# Patient Record
Sex: Female | Born: 1985 | Race: Black or African American | Hispanic: No | Marital: Single | Smoking: Never smoker
Health system: Southern US, Community
[De-identification: ages and names within clinical notes are randomized; demographics above are authoritative.]

## PROBLEM LIST (undated history)

## (undated) DIAGNOSIS — D649 Anemia, unspecified: Secondary | ICD-10-CM

## (undated) DIAGNOSIS — IMO0002 Reserved for concepts with insufficient information to code with codable children: Secondary | ICD-10-CM

## (undated) DIAGNOSIS — N83209 Unspecified ovarian cyst, unspecified side: Secondary | ICD-10-CM

## (undated) DIAGNOSIS — A749 Chlamydial infection, unspecified: Secondary | ICD-10-CM

## (undated) DIAGNOSIS — J45909 Unspecified asthma, uncomplicated: Secondary | ICD-10-CM

## (undated) HISTORY — PX: WISDOM TOOTH EXTRACTION: SHX21

## (undated) HISTORY — DX: Reserved for concepts with insufficient information to code with codable children: IMO0002

## (undated) HISTORY — DX: Chlamydial infection, unspecified: A74.9

## (undated) HISTORY — DX: Unspecified ovarian cyst, unspecified side: N83.209

## (undated) HISTORY — DX: Anemia, unspecified: D64.9

## (undated) HISTORY — DX: Unspecified asthma, uncomplicated: J45.909

---

## 2005-08-25 DIAGNOSIS — R87619 Unspecified abnormal cytological findings in specimens from cervix uteri: Secondary | ICD-10-CM

## 2005-08-25 DIAGNOSIS — IMO0002 Reserved for concepts with insufficient information to code with codable children: Secondary | ICD-10-CM

## 2005-08-25 HISTORY — DX: Reserved for concepts with insufficient information to code with codable children: IMO0002

## 2005-08-25 HISTORY — DX: Unspecified abnormal cytological findings in specimens from cervix uteri: R87.619

## 2009-08-25 DIAGNOSIS — N83209 Unspecified ovarian cyst, unspecified side: Secondary | ICD-10-CM

## 2009-08-25 HISTORY — DX: Unspecified ovarian cyst, unspecified side: N83.209

## 2009-09-25 ENCOUNTER — Inpatient Hospital Stay (HOSPITAL_COMMUNITY): Admission: AD | Admit: 2009-09-25 | Discharge: 2009-09-25 | Payer: Self-pay | Admitting: Obstetrics and Gynecology

## 2009-09-30 ENCOUNTER — Inpatient Hospital Stay (HOSPITAL_COMMUNITY): Admission: AD | Admit: 2009-09-30 | Discharge: 2009-10-01 | Payer: Self-pay | Admitting: Obstetrics and Gynecology

## 2009-12-05 ENCOUNTER — Encounter: Admission: RE | Admit: 2009-12-05 | Discharge: 2009-12-05 | Payer: Self-pay | Admitting: Emergency Medicine

## 2010-01-14 ENCOUNTER — Inpatient Hospital Stay (HOSPITAL_COMMUNITY): Admission: AD | Admit: 2010-01-14 | Discharge: 2010-01-14 | Payer: Self-pay | Admitting: Obstetrics & Gynecology

## 2010-04-21 ENCOUNTER — Emergency Department (HOSPITAL_COMMUNITY): Admission: EM | Admit: 2010-04-21 | Discharge: 2010-04-21 | Payer: Self-pay | Admitting: Family Medicine

## 2010-04-30 ENCOUNTER — Emergency Department (HOSPITAL_COMMUNITY): Admission: EM | Admit: 2010-04-30 | Discharge: 2010-04-30 | Payer: Self-pay | Admitting: Emergency Medicine

## 2010-10-08 ENCOUNTER — Inpatient Hospital Stay (INDEPENDENT_AMBULATORY_CARE_PROVIDER_SITE_OTHER)
Admission: RE | Admit: 2010-10-08 | Discharge: 2010-10-08 | Disposition: A | Discharge status: Home / Self Care | Payer: PRIVATE HEALTH INSURANCE | Source: Ambulatory Visit | Attending: Emergency Medicine | Admitting: Emergency Medicine

## 2010-10-08 DIAGNOSIS — M67919 Unspecified disorder of synovium and tendon, unspecified shoulder: Secondary | ICD-10-CM

## 2010-11-06 ENCOUNTER — Inpatient Hospital Stay (HOSPITAL_COMMUNITY)
Admission: RE | Admit: 2010-11-06 | Discharge: 2010-11-06 | Disposition: A | Discharge status: Home / Self Care | Payer: PRIVATE HEALTH INSURANCE | Source: Ambulatory Visit | Attending: Family Medicine | Admitting: Family Medicine

## 2010-11-11 LAB — WET PREP, GENITAL
Clue Cells Wet Prep HPF POC: NONE SEEN
Yeast Wet Prep HPF POC: NONE SEEN

## 2010-11-11 LAB — CBC: RBC: 3.7 MIL/uL — ABNORMAL LOW (ref 3.87–5.11)

## 2010-11-11 LAB — GC/CHLAMYDIA PROBE AMP, GENITAL: Chlamydia, DNA Probe: NEGATIVE

## 2010-11-14 LAB — URINALYSIS, ROUTINE W REFLEX MICROSCOPIC
Bilirubin Urine: NEGATIVE
Glucose, UA: NEGATIVE mg/dL
Hgb urine dipstick: NEGATIVE
Specific Gravity, Urine: 1.025 (ref 1.005–1.030)
Urobilinogen, UA: 0.2 mg/dL (ref 0.0–1.0)

## 2010-11-14 LAB — ABO/RH: ABO/RH(D): A POS

## 2010-11-14 LAB — CBC
HCT: 36.9 % (ref 36.0–46.0)
MCV: 95.5 fL (ref 78.0–100.0)
RBC: 3.86 MIL/uL — ABNORMAL LOW (ref 3.87–5.11)
WBC: 6.8 10*3/uL (ref 4.0–10.5)

## 2010-11-14 LAB — WET PREP, GENITAL

## 2010-11-14 LAB — HCG, QUANTITATIVE, PREGNANCY: hCG, Beta Chain, Quant, S: 49213 m[IU]/mL — ABNORMAL HIGH (ref <5)

## 2010-11-14 LAB — GC/CHLAMYDIA PROBE AMP, GENITAL: Chlamydia, DNA Probe: NEGATIVE

## 2010-12-11 ENCOUNTER — Inpatient Hospital Stay (INDEPENDENT_AMBULATORY_CARE_PROVIDER_SITE_OTHER)
Admission: RE | Admit: 2010-12-11 | Discharge: 2010-12-11 | Disposition: A | Discharge status: Home / Self Care | Payer: PRIVATE HEALTH INSURANCE | Source: Ambulatory Visit | Attending: Family Medicine | Admitting: Family Medicine

## 2010-12-11 DIAGNOSIS — J40 Bronchitis, not specified as acute or chronic: Secondary | ICD-10-CM

## 2010-12-11 DIAGNOSIS — B86 Scabies: Secondary | ICD-10-CM

## 2010-12-15 ENCOUNTER — Inpatient Hospital Stay (INDEPENDENT_AMBULATORY_CARE_PROVIDER_SITE_OTHER)
Admission: RE | Admit: 2010-12-15 | Discharge: 2010-12-15 | Disposition: A | Discharge status: Home / Self Care | Payer: PRIVATE HEALTH INSURANCE | Source: Ambulatory Visit | Attending: Family Medicine | Admitting: Family Medicine

## 2010-12-15 DIAGNOSIS — K5289 Other specified noninfective gastroenteritis and colitis: Secondary | ICD-10-CM

## 2010-12-15 LAB — POCT URINALYSIS DIP (DEVICE)
Bilirubin Urine: NEGATIVE
Glucose, UA: NEGATIVE mg/dL
Hgb urine dipstick: NEGATIVE
Ketones, ur: NEGATIVE mg/dL
Nitrite: NEGATIVE
Specific Gravity, Urine: 1.02 (ref 1.005–1.030)
Urobilinogen, UA: 1 mg/dL (ref 0.0–1.0)
pH: 7.5 (ref 5.0–8.0)

## 2011-09-09 ENCOUNTER — Emergency Department (HOSPITAL_COMMUNITY)
Admission: EM | Admit: 2011-09-09 | Discharge: 2011-09-09 | Disposition: A | Discharge status: Home / Self Care | Payer: Self-pay | Attending: Emergency Medicine | Admitting: Emergency Medicine

## 2011-09-09 ENCOUNTER — Encounter (HOSPITAL_COMMUNITY): Payer: Self-pay | Admitting: *Deleted

## 2011-09-09 DIAGNOSIS — S01409A Unspecified open wound of unspecified cheek and temporomandibular area, initial encounter: Secondary | ICD-10-CM | POA: Insufficient documentation

## 2011-09-09 DIAGNOSIS — S0181XA Laceration without foreign body of other part of head, initial encounter: Secondary | ICD-10-CM

## 2011-09-09 DIAGNOSIS — W260XXA Contact with knife, initial encounter: Secondary | ICD-10-CM | POA: Insufficient documentation

## 2011-09-09 NOTE — ED Provider Notes (Signed)
History     CSN: [id number]  Arrival date & time 09/09/11  0037   First MD Initiated Contact with Patient 09/09/11 0200      Chief Complaint  Patient presents with  . Laceration    (Consider location/radiation/quality/duration/timing/severity/associated sxs/prior treatment) Patient is a 26 y.o. female presenting with skin laceration. The history is provided by the patient. No language interpreter was used.  Laceration  The incident occurred 1 to 2 hours ago. The laceration is located on the face. The laceration is 2 cm in size. The laceration mechanism was a a dirty knife. The pain is mild. The pain has been constant since onset. She reports no foreign bodies present. Her tetanus status is UTD.    History reviewed. No pertinent past medical history.  History reviewed. No pertinent past surgical history.  History reviewed. No pertinent family history.  History  Substance Use Topics  . Smoking status: Never Smoker   . Smokeless tobacco: Not on file  . Alcohol Use: Yes    OB History    Grav Para Term Preterm Abortions TAB SAB Ect Mult Living                  Review of Systems  Constitutional: Negative for fever, activity change, appetite change and fatigue.  HENT: Negative for congestion, sore throat, rhinorrhea, neck pain and neck stiffness.   Respiratory: Negative for cough and shortness of breath.   Cardiovascular: Negative for chest pain and palpitations.  Gastrointestinal: Negative for nausea, vomiting and abdominal pain.  Genitourinary: Negative for dysuria, urgency, frequency and flank pain.  Skin: Positive for wound. Negative for rash.  Neurological: Negative for dizziness, weakness, light-headedness, numbness and headaches.  All other systems reviewed and are negative.    Allergies  Review of patient's allergies indicates no known allergies.  Home Medications  No current outpatient prescriptions on file.  BP 141/77  Pulse 116  Temp(Src) 99.6 F (37.6  C) (Oral)  Resp 20  SpO2 96%  LMP 09/07/2011  Physical Exam  Nursing note and vitals reviewed. Constitutional: She is oriented to person, place, and time. She appears well-developed and well-nourished. No distress.  HENT:  Head: Normocephalic and atraumatic.  Mouth/Throat: Oropharynx is clear and moist.  Eyes: Conjunctivae and EOM are normal. Pupils are equal, round, and reactive to light.  Neck: Normal range of motion. Neck supple.  Cardiovascular: Normal rate, regular rhythm, normal heart sounds and intact distal pulses.  Exam reveals no gallop and no friction rub.   No murmur heard. Pulmonary/Chest: Effort normal and breath sounds normal. No respiratory distress.  Abdominal: Soft. Bowel sounds are normal. There is no tenderness.  Musculoskeletal: Normal range of motion. She exhibits no tenderness.  Neurological: She is alert and oriented to person, place, and time. No cranial nerve deficit.  Skin: Skin is warm and dry. No rash noted.       2 cm superficial laceration into the dermis of the left cheek    ED Course  Procedures (including critical care time)  LACERATION REPAIR Performed by: Dayton Bailiff Authorized by: Dayton Bailiff Consent: Verbal consent obtained. Risks and benefits: risks, benefits and alternatives were discussed Consent given by: patient Patient identity confirmed: provided demographic data Prepped and Draped in normal sterile fashion Wound explored  Laceration Location: face  Laceration Length: 2 cm  No Foreign Bodies seen or palpated  Anesthesia: local infiltration  Irrigation method: syringe Amount of cleaning: standard  Skin closure: dermabond  Patient tolerance: Patient tolerated the  procedure well with no immediate complications.   Labs Reviewed - No data to display No results found.   1. Facial laceration       MDM  Tetanus is up-to-date so no booster is required. Her laceration was glued in the emergency department. There is  good wound reapproximation. She is discharged with wound care instructions.        Dayton Bailiff, MD 09/09/11 [phone]
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
## 2011-09-13 ENCOUNTER — Emergency Department (HOSPITAL_COMMUNITY)
Admission: EM | Admit: 2011-09-13 | Discharge: 2011-09-13 | Disposition: A | Discharge status: Home / Self Care | Payer: Self-pay | Attending: Emergency Medicine | Admitting: Emergency Medicine

## 2011-09-13 ENCOUNTER — Encounter (HOSPITAL_COMMUNITY): Payer: Self-pay | Admitting: *Deleted

## 2011-09-13 DIAGNOSIS — Z09 Encounter for follow-up examination after completed treatment for conditions other than malignant neoplasm: Secondary | ICD-10-CM | POA: Insufficient documentation

## 2011-09-13 DIAGNOSIS — T8130XA Disruption of wound, unspecified, initial encounter: Secondary | ICD-10-CM

## 2011-09-13 DIAGNOSIS — T8133XA Disruption of traumatic injury wound repair, initial encounter: Secondary | ICD-10-CM | POA: Insufficient documentation

## 2011-09-13 DIAGNOSIS — Y849 Medical procedure, unspecified as the cause of abnormal reaction of the patient, or of later complication, without mention of misadventure at the time of the procedure: Secondary | ICD-10-CM | POA: Insufficient documentation

## 2011-09-13 MED ORDER — LIDOCAINE HCL (PF) 1 % IJ SOLN
INTRAMUSCULAR | Status: AC
  Filled 2011-09-13: qty 5

## 2011-09-13 NOTE — ED Provider Notes (Signed)
History     CSN: [id number]  Arrival date & time 09/13/11  0106   First MD Initiated Contact with Patient 09/13/11 0136      Chief Complaint  Patient presents with  . Wound Check    (Consider location/radiation/quality/duration/timing/severity/associated sxs/prior treatment) HPI Comments: Patient was seen on January 15 for a laceration to her left cheek, cut with a knife.  She opted to have Dermabond placed since that time.  The Dermabond has come off and the wound has opened.  No bleeding no discharge.  Patient is here requesting additional repair  Patient is a 26 y.o. female presenting with wound check. The history is provided by the patient.  Wound Check  She was treated in the ED 5 to 10 days ago. Previous treatment in the ED includes laceration repair. There has been no treatment since the wound repair. There has been no drainage from the wound. There is no redness present. There is no swelling present. The pain has no pain.    History reviewed. No pertinent past medical history.  History reviewed. No pertinent past surgical history.  History reviewed. No pertinent family history.  History  Substance Use Topics  . Smoking status: Never Smoker   . Smokeless tobacco: Not on file  . Alcohol Use: Yes    OB History    Grav Para Term Preterm Abortions TAB SAB Ect Mult Living                  Review of Systems  Constitutional: Negative for activity change.  HENT: Negative for facial swelling.   Musculoskeletal: Negative for arthralgias.  Neurological: Negative for dizziness.    Allergies  Review of patient's allergies indicates no known allergies.  Home Medications  No current outpatient prescriptions on file.  BP 112/62  Pulse 74  Temp(Src) 98.2 F (36.8 C) (Oral)  Resp 20  SpO2 97%  LMP 09/07/2011  Physical Exam  Skin:       1.5 cm superficial laceration mid L cheek without surrounding erythema exudate     ED Course  Procedures (including critical  care time)  Labs Reviewed - No data to display No results found.   1. Wound dehiscence     Discussed with patient the fact that at this point suturing the laceration is not an option would apply Steri-Strips to approximate the wound edges as best we can and patient agrees to this  MDM  Wound dehisced since        Arman Filter, NP 09/13/11 0152  Arman Filter, NP 09/13/11 0154  Arman Filter, NP 09/13/11 0155

## 2011-09-13 NOTE — Discharge Instructions (Signed)
Wound Dehiscence Wound dehiscence is when a surgical cut (incision) opens up. It usually happens 7 to 10 days after surgery. You may have pain, a fever, or have more fluid coming from the cut. It should be treated early. HOME CARE  Only take medicines as told by your doctor.   Take your medicines (antibiotics) as told. Finish them even if you start to feel better.   Wash your wound with warm, soapy water 2 times a day, or as told. Pat the wound dry. Do not rub the wound.   Change bandages (dressings) as often as told. Wash your hands before and after changing bandages. Apply bandages as told.   Take showers. Do not soak the wound, bathe, or swim until your wound is healed.   Avoid exercises that make you sweat.   Use medicines that stop itching as told by your doctor. The wound may itch as it heals. Do not pick or scratch at the wound.   Do not lift more than 10 pounds (4.5 kilograms) until the wound is healed, or as told by your doctor.   Keep all doctor visits as told.  GET HELP RIGHT AWAY IF:   You have more puffiness (swelling) or redness around the wound.   You have more pain in the wound.   You have yellowish white fluid (pus) coming from the wound.   More of the wound breaks open.   You have a fever.  MAKE SURE YOU:   Understand these instructions.   Will watch your condition.   Will get help right away if you are not doing well or get worse.  Document Released: 07/30/2009 Document Revised: 04/23/2011 Document Reviewed: 12/15/2010 [facility] Patient Information 2012 [place], [place]. You were given.  Referral to Dr. Francoise Schaumann, who is a plastic surgeon if you are on the with the appearance of your laceration once it is healed

## 2011-09-13 NOTE — ED Notes (Signed)
She was here jan 15th with a lt face laceration that was glued and it has reopened x 2.

## 2011-09-13 NOTE — ED Provider Notes (Signed)
Medical screening examination/treatment/procedure(s) were performed by non-physician practitioner and as supervising physician I was immediately available for consultation/collaboration. Eithen Castiglia Y.   Gavin Pound. Oletta Lamas, MD 09/13/11 [phone]

## 2012-03-24 ENCOUNTER — Ambulatory Visit (INDEPENDENT_AMBULATORY_CARE_PROVIDER_SITE_OTHER): Payer: BC Managed Care – HMO | Admitting: Obstetrics and Gynecology

## 2012-03-24 ENCOUNTER — Encounter: Payer: Self-pay | Admitting: Obstetrics and Gynecology

## 2012-03-24 VITALS — BP 104/64 | HR 68 | Temp 99.5°F | Resp 16 | Ht 63.0 in | Wt 226.0 lb

## 2012-03-24 DIAGNOSIS — N939 Abnormal uterine and vaginal bleeding, unspecified: Secondary | ICD-10-CM

## 2012-03-24 DIAGNOSIS — Z124 Encounter for screening for malignant neoplasm of cervix: Secondary | ICD-10-CM

## 2012-03-24 DIAGNOSIS — Z202 Contact with and (suspected) exposure to infections with a predominantly sexual mode of transmission: Secondary | ICD-10-CM

## 2012-03-24 DIAGNOSIS — Z9189 Other specified personal risk factors, not elsewhere classified: Secondary | ICD-10-CM

## 2012-03-24 DIAGNOSIS — N926 Irregular menstruation, unspecified: Secondary | ICD-10-CM

## 2012-03-24 DIAGNOSIS — N83209 Unspecified ovarian cyst, unspecified side: Secondary | ICD-10-CM | POA: Insufficient documentation

## 2012-03-24 DIAGNOSIS — A749 Chlamydial infection, unspecified: Secondary | ICD-10-CM | POA: Insufficient documentation

## 2012-03-24 DIAGNOSIS — IMO0002 Reserved for concepts with insufficient information to code with codable children: Secondary | ICD-10-CM | POA: Insufficient documentation

## 2012-03-24 DIAGNOSIS — Z8742 Personal history of other diseases of the female genital tract: Secondary | ICD-10-CM

## 2012-03-24 LAB — CBC
Hemoglobin: 12.7 g/dL (ref 12.0–15.0)
MCV: 90.5 fL (ref 78.0–100.0)
Platelets: 281 10*3/uL (ref 150–400)
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
## 2012-03-25 LAB — HSV 1 ANTIBODY, IGG: HSV 1 Glycoprotein G Ab, IgG: 3.94 IV — ABNORMAL HIGH

## 2012-03-25 LAB — HIV ANTIBODY (ROUTINE TESTING W REFLEX): HIV: NONREACTIVE

## 2012-03-25 LAB — HEPATITIS C ANTIBODY: HCV Ab: NEGATIVE

## 2012-03-26 LAB — PAP IG, CT-NG, RFX HPV ASCU
Chlamydia Probe Amp: POSITIVE — AB
GC Probe Amp: NEGATIVE

## 2012-04-01 ENCOUNTER — Other Ambulatory Visit: Payer: Self-pay | Admitting: Obstetrics and Gynecology

## 2012-04-01 ENCOUNTER — Ambulatory Visit (INDEPENDENT_AMBULATORY_CARE_PROVIDER_SITE_OTHER): Payer: BC Managed Care – HMO | Admitting: Obstetrics and Gynecology

## 2012-04-01 ENCOUNTER — Encounter: Payer: Self-pay | Admitting: Obstetrics and Gynecology

## 2012-04-01 ENCOUNTER — Ambulatory Visit (INDEPENDENT_AMBULATORY_CARE_PROVIDER_SITE_OTHER): Payer: BC Managed Care – HMO

## 2012-04-01 VITALS — BP 120/62 | Temp 98.6°F | Ht 63.0 in | Wt 230.0 lb

## 2012-04-01 DIAGNOSIS — N939 Abnormal uterine and vaginal bleeding, unspecified: Secondary | ICD-10-CM

## 2012-04-01 DIAGNOSIS — N926 Irregular menstruation, unspecified: Secondary | ICD-10-CM

## 2012-04-01 DIAGNOSIS — A749 Chlamydial infection, unspecified: Secondary | ICD-10-CM

## 2012-04-01 DIAGNOSIS — Z8742 Personal history of other diseases of the female genital tract: Secondary | ICD-10-CM

## 2012-04-01 DIAGNOSIS — N83209 Unspecified ovarian cyst, unspecified side: Secondary | ICD-10-CM

## 2012-04-01 MED ORDER — AZITHROMYCIN 500 MG PO TABS: ORAL_TABLET | ORAL | Status: DC

## 2012-04-01 NOTE — Progress Notes (Signed)
FOLLOWUP  Pt here to f/u from 07/31/20213 visit for abnormal uterine bleeding. Pt states that she has just been spotting for the past couple days. Also c/o breast pain and right side pain.    ULTRASOUND: Uterus: Length: 8.08 cm   Width:  5.26 cm   Height:  4.06 cm  Endo thickness:  0.460 mm   Left ovary: Dominant follicle - measuring 4cm x 1.9cm x 2.3c. A cumulus opphorus is seen  Right ovary:Normal Fibroids:no   CDS fluid:no  Comment: Anteverted uterus. No uterine abnormality is seen. Endometrium is thin and unremarkable. Normal pelvic ultrasound.  CHL: POSITIVE  ASSESSMENT: Abnl uterine bleeding possibly due to cervicitis vs hormonal implant Chlamydia Mastodynia without caffiene intake.  Possibly due to hormonal implant Follicular cyst, probably self limited Pelvic pain possibly due to Chlamydia  RECCOMENDATION: zithromax 1 gm po sent to pharmacy Pt declines partner treatment.  Importance re-emphasized.  Condoms recommended. followup 4 weeks for repeat U/S and TOC chlamydia STD PROTOCOL completed

## 2012-04-15 ENCOUNTER — Telehealth: Payer: Self-pay | Admitting: Obstetrics and Gynecology

## 2012-04-15 NOTE — Telephone Encounter (Signed)
Tc to pt per telephone call. Pt c/o left side pain(dull ache) x couple of days. No fever. Pt's lmp on 04-02-12 with bleeding still occuring;however has decreased. Pt passing occ blood clots(largest clot=quarter size). Pt taking otc Ibuprofen and unsure if helps with pain due to pain meds making her fall asleep. Pt with an appt on 04/19/12 for ultrasound fu and visit with vph. Informed pt to cont Ibuprofen as directed, increase water intake, try heating pad/compress. Informed pt will cb if vph gives any further recs. Pt to keep appt on 04/19/12. Pt voices understanding.

## 2012-04-15 NOTE — Telephone Encounter (Signed)
vph pt 

## 2012-04-19 ENCOUNTER — Ambulatory Visit (INDEPENDENT_AMBULATORY_CARE_PROVIDER_SITE_OTHER): Payer: BC Managed Care – HMO

## 2012-04-19 ENCOUNTER — Ambulatory Visit (INDEPENDENT_AMBULATORY_CARE_PROVIDER_SITE_OTHER): Payer: BC Managed Care – HMO | Admitting: Obstetrics and Gynecology

## 2012-04-19 ENCOUNTER — Encounter: Payer: Self-pay | Admitting: Obstetrics and Gynecology

## 2012-04-19 ENCOUNTER — Other Ambulatory Visit: Payer: Self-pay | Admitting: Obstetrics and Gynecology

## 2012-04-19 VITALS — BP 110/68 | Ht 63.0 in | Wt 236.0 lb

## 2012-04-19 DIAGNOSIS — N83209 Unspecified ovarian cyst, unspecified side: Secondary | ICD-10-CM

## 2012-04-19 DIAGNOSIS — A749 Chlamydial infection, unspecified: Secondary | ICD-10-CM

## 2012-04-19 NOTE — Progress Notes (Signed)
FOLLOWUP VISIT  SUBJECTIVE:   Sharp pain resolved.  Still has some dull achy pain.  Not requiring meds.  OBJECTIVE:  ULTRASOUND: Uterus: Length: 7.02 cm   Width:  4.72 cm   Height:  3.86 cm  Endometrium:  0.405 mm Endo thickness:  n/a   Left ovary:Normal Right ovary:Normal Fibroids:no   CDS fluid:no  Comment: Transvaginal images of pelvis. Urinary bladder - unremarkable. Anteverted uterus. Normal appearance. Think endometrium. Both ovaries are well visualized and are WNLs. LTOV dominant follicle/cyst has resolved. No CDS fluid.   Chlaymydia positive.  Pt treated.  Denies sexual activity .  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 TSH, CBC nl  BP 110/68  Ht 5\' 3"  (1.6 m)  Wt 236 lb (107.049 kg)  BMI 41.81 kg/m2  LMP 04/02/2012  Pelvic exam: normal external genitalia, vulva, vagina, cervix, uterus and adnexa  ASSESSMENT: S/P RX for CHL Resolved ovarian cyst  RECOMMENDATION: CHL TOC done F/u 7/14  For aex or prn. Condoms for intercourse.  Marland Kitchen

## 2012-04-20 LAB — GC/CHLAMYDIA PROBE AMP, GENITAL: Chlamydia, DNA Probe: NEGATIVE

## 2012-06-16 ENCOUNTER — Telehealth: Payer: Self-pay | Admitting: Obstetrics and Gynecology

## 2012-06-16 NOTE — Telephone Encounter (Signed)
Pt c/o pelvic pain and would also like her nexplanon removed. Scheduled appointment with EP for 06/25/12, pt agreeable.

## 2012-06-16 NOTE — Telephone Encounter (Signed)
Lm on vm for pt to call back.

## 2012-06-25 ENCOUNTER — Encounter: Payer: Self-pay | Admitting: Obstetrics and Gynecology

## 2012-06-25 ENCOUNTER — Ambulatory Visit (INDEPENDENT_AMBULATORY_CARE_PROVIDER_SITE_OTHER): Payer: BC Managed Care – HMO | Admitting: Obstetrics and Gynecology

## 2012-06-25 VITALS — BP 110/70 | Ht 60.0 in | Wt 232.0 lb

## 2012-06-25 DIAGNOSIS — Z308 Encounter for other contraceptive management: Secondary | ICD-10-CM

## 2012-06-25 DIAGNOSIS — Z139 Encounter for screening, unspecified: Secondary | ICD-10-CM

## 2012-06-25 DIAGNOSIS — N949 Unspecified condition associated with female genital organs and menstrual cycle: Secondary | ICD-10-CM

## 2012-06-25 DIAGNOSIS — R102 Pelvic and perineal pain: Secondary | ICD-10-CM

## 2012-06-25 LAB — POCT URINALYSIS DIPSTICK
Bilirubin, UA: NEGATIVE
Ketones, UA: NEGATIVE
Protein, UA: NEGATIVE
Spec Grav, UA: 1.025
pH, UA: 7

## 2012-06-25 NOTE — Patient Instructions (Signed)
Call [facility] [phone]:  -for temperature of 100.4 degrees Fahrenheit or more -pain not improved with over the counter pain medications (Ibuprofen, Advil, Aleve,     Tylenol or acetaminophen) -for excessive bleeding from insertion site -for excessive swelling redness or green drainage from your insertion site -for any other concerns -keep insertion site clean, dry and covered  for 24 hours -you may remove pressure bandage in 1-4 hours  

## 2012-06-25 NOTE — Progress Notes (Signed)
26 YO with Nexplanon inserted over a year ago but 2 weeks ago started to have sharp intermittant pains in pelvic area but now it is constant and dull. Patient has had this evaluated and admits to a history of an ovarian cyst.  Declines other contraception for now.  O: Right medial upper arm:  Nexplanon removed per protocol with incision closed with steri-strips and Benzoin;  dressed with sterile band-aids and 4 x 4 gauze and Kling pressure dressing  A:  Nexplanon Removal  P: Reviewed signs and symptoms of infection and wound care instructions      RTO-follow up in 1 week  Melynda Krzywicki, PA-C

## 2012-06-30 ENCOUNTER — Telehealth: Payer: Self-pay | Admitting: Obstetrics and Gynecology

## 2012-06-30 NOTE — Telephone Encounter (Signed)
Spoke with pt Denise Roberts msg pt states had nexplanon removed on Friday states pelvic pain getting worse passing clots pt has appt 07/01/12 at 4:00 with ep pt voice understanding

## 2012-07-01 ENCOUNTER — Encounter: Payer: Self-pay | Admitting: Obstetrics and Gynecology

## 2012-07-01 ENCOUNTER — Ambulatory Visit (INDEPENDENT_AMBULATORY_CARE_PROVIDER_SITE_OTHER): Payer: BC Managed Care – HMO | Admitting: Obstetrics and Gynecology

## 2012-07-01 VITALS — BP 112/72 | Temp 99.3°F

## 2012-07-01 DIAGNOSIS — R102 Pelvic and perineal pain: Secondary | ICD-10-CM

## 2012-07-01 DIAGNOSIS — Z309 Encounter for contraceptive management, unspecified: Secondary | ICD-10-CM

## 2012-07-01 DIAGNOSIS — N949 Unspecified condition associated with female genital organs and menstrual cycle: Secondary | ICD-10-CM

## 2012-07-01 LAB — POCT URINALYSIS DIPSTICK
Bilirubin, UA: NEGATIVE
Glucose, UA: NEGATIVE
Ketones, UA: NEGATIVE
Spec Grav, UA: 1.01
Urobilinogen, UA: NEGATIVE

## 2012-07-01 MED ORDER — HYDROCODONE-ACETAMINOPHEN 5-300 MG PO TABS: 1.0000 | ORAL_TABLET | ORAL | Status: DC | PRN

## 2012-07-01 NOTE — Progress Notes (Signed)
26 YO complains of having abdominal pain x 3 weeks that will go and come but awaken from sleep-didn't mention it last week at Nexplanon removal. Will happen randomly and last for about five minutes.  Yesterday she felt like she was being split in two.  Denies changes with bowel movements, dyspareunia or vaginitis symptoms.  The only urinary symptom is frequency.  O: Abdomen: soft with tenderness and voluntary guarding in right lower quadrant       Pelvic: EGBUS-blood stained but wnl, vagina-moderate blood, uterus-mildly tender, adnexae-no tenderness or masses      [exam limited by patient's discomfort and anxiety]  UPT-negative U/A: SG-1.010, pH-8.0, trace-leuk  A: Nexplanon Follow up     Pelvic pain  P: Pelvic ultrasound for pelvic pain       Vicodin # 20 1 po q 6 hours prn-breakthrough pain      Continue  Ibuprofen 600 mg pc q 6 hours       RTO-tomorrow for ultrasound  Shloima Clinch, PA-C

## 2012-07-01 NOTE — Progress Notes (Signed)
Contraception: none History of STD:  history of chlamydia 2006 History of ovarian cyst: yes:  8/13 History of fibroids: no History of endometriosis:no Previous ultrasound:yes:  2013  Urinary symptoms: urinary frequency Gastro-intestinal symptoms:  Constipation: no     Diarrhea: no     Nausea: no     Vomiting: no     Fever: yes Vaginal discharge: no vaginal discharge  Pt states when nexplanon was removed; pt started to have large clots; did not cycle when she was using nexplanon(since August)

## 2012-07-02 ENCOUNTER — Encounter: Payer: Self-pay | Admitting: Obstetrics and Gynecology

## 2012-07-02 ENCOUNTER — Ambulatory Visit (INDEPENDENT_AMBULATORY_CARE_PROVIDER_SITE_OTHER): Payer: BC Managed Care – HMO

## 2012-07-02 ENCOUNTER — Ambulatory Visit (INDEPENDENT_AMBULATORY_CARE_PROVIDER_SITE_OTHER): Payer: BC Managed Care – HMO | Admitting: Obstetrics and Gynecology

## 2012-07-02 VITALS — BP 114/72 | Temp 98.9°F | Wt 233.0 lb

## 2012-07-02 DIAGNOSIS — R102 Pelvic and perineal pain unspecified side: Secondary | ICD-10-CM | POA: Insufficient documentation

## 2012-07-02 DIAGNOSIS — N949 Unspecified condition associated with female genital organs and menstrual cycle: Secondary | ICD-10-CM

## 2012-07-02 NOTE — Patient Instructions (Signed)
Internist or [facility]  Physician Referral Services [phone]

## 2012-07-02 NOTE — Progress Notes (Signed)
26 YO seen yesterday for pelvic pain returns for ultrasound.  Reports relief with the Vicodin.  O: Ultrasound: uterus-7.91 x 4.10 x 3.86 cm with normal appearing ovaries/adnexae and no fluid in cul-de-sac  A: Pelvic Pain  P: Reviewed causes of pelvic pain: urogenital, previous surgery, gastrointestinal and musculoskeletal.      Advised that next GYN evaluation  may have to be surgical (laparoscopy)       Folllow up with PCP for further evaluation; Number for Physician's Referral Services given       RTO-as scheduled or prn  Kyland No, PA-C

## 2012-07-06 ENCOUNTER — Encounter: Payer: BC Managed Care – HMO | Admitting: Obstetrics and Gynecology

## 2012-07-07 ENCOUNTER — Encounter (HOSPITAL_COMMUNITY): Payer: Self-pay | Admitting: Emergency Medicine

## 2012-07-07 ENCOUNTER — Emergency Department (HOSPITAL_COMMUNITY)
Admission: EM | Admit: 2012-07-07 | Discharge: 2012-07-08 | Disposition: A | Discharge status: Home / Self Care | Payer: Self-pay | Attending: Emergency Medicine | Admitting: Emergency Medicine

## 2012-07-07 ENCOUNTER — Emergency Department (HOSPITAL_COMMUNITY): Payer: Self-pay

## 2012-07-07 DIAGNOSIS — Z862 Personal history of diseases of the blood and blood-forming organs and certain disorders involving the immune mechanism: Secondary | ICD-10-CM | POA: Insufficient documentation

## 2012-07-07 DIAGNOSIS — J45909 Unspecified asthma, uncomplicated: Secondary | ICD-10-CM | POA: Insufficient documentation

## 2012-07-07 DIAGNOSIS — M549 Dorsalgia, unspecified: Secondary | ICD-10-CM | POA: Insufficient documentation

## 2012-07-07 DIAGNOSIS — Z3202 Encounter for pregnancy test, result negative: Secondary | ICD-10-CM | POA: Insufficient documentation

## 2012-07-07 DIAGNOSIS — Z8742 Personal history of other diseases of the female genital tract: Secondary | ICD-10-CM | POA: Insufficient documentation

## 2012-07-07 DIAGNOSIS — Z8619 Personal history of other infectious and parasitic diseases: Secondary | ICD-10-CM | POA: Insufficient documentation

## 2012-07-07 LAB — URINALYSIS, ROUTINE W REFLEX MICROSCOPIC
Ketones, ur: NEGATIVE mg/dL
Leukocytes, UA: NEGATIVE
Nitrite: NEGATIVE
Protein, ur: NEGATIVE mg/dL
Urobilinogen, UA: 0.2 mg/dL (ref 0.0–1.0)

## 2012-07-07 LAB — POCT PREGNANCY, URINE: Preg Test, Ur: NEGATIVE

## 2012-07-07 NOTE — ED Provider Notes (Signed)
History     CSN: [id number]  Arrival date & time 07/07/12  2045   First MD Initiated Contact with Patient 07/07/12 2100      Chief Complaint  Patient presents with  . Back Pain    (Consider location/radiation/quality/duration/timing/severity/associated sxs/prior treatment) The history is provided by the patient and medical records. No language interpreter was used.   Denise Roberts 26 y.o. female   Cc. Back pain x 3 days of worsening right flank pain. Woke her for sleep this morning. Nothing makes it worse.  hydordocodone from [facility] at [place] did not help.  Patient had subjective fever 2 days ago with shaking chills lasted half the day. Took codeine. Denies weakness, loss of bowel/bladder function or saddle anesthesia. Denies neck stiffness, headache, rash.  Denies  recent procedures to back. Denies DOE, SOB, chest tightness or pressure, radiation to left arm, jaw or back, or diaphoresis. Denies dysuria, flank pain, suprapubic pain, frequency, urgency, or hematuria. Denies headaches, light headedness, weakness, visual disturbances. Denies abdominal pain, nausea, vomiting, diarrhea or constipation.    Past Medical History  Diagnosis Date  . Chlamydia   . Ovarian cyst 2011  . Anemia   . Asthma   . Abnormal pap 2007    Past Surgical History  Procedure Date  . Wisdom tooth extraction     Family History  Problem Relation Age of Onset  . Cancer Maternal Grandfather     prostate  . Cancer Brother   . Diabetes Maternal Aunt     History  Substance Use Topics  . Smoking status: Never Smoker   . Smokeless tobacco: Never Used  . Alcohol Use: Yes    OB History    Grav Para Term Preterm Abortions TAB SAB Ect Mult Living   2    2     0      Review of Systems  Constitutional: Negative.   HENT: Negative.   Eyes: Negative.   Respiratory: Negative.   Cardiovascular: Negative.   Gastrointestinal: Negative.   Genitourinary: Negative.   Musculoskeletal: Positive  for back pain.  Skin: Negative.   Neurological: Negative.   Psychiatric/Behavioral: Negative.   All other systems reviewed and are negative.    Allergies  Review of patient's allergies indicates no known allergies.  Home Medications   Current Outpatient Rx  Name  Route  Sig  Dispense  Refill  . HYDROCODONE-ACETAMINOPHEN 5-300 MG PO TABS   Oral   Take 1 tablet by mouth every 4 (four) hours as needed. For pain           BP 134/78  Pulse 82  Temp 98.6 F (37 C) (Oral)  Resp 14  SpO2 98%  LMP 06/25/2012  Physical Exam  Constitutional: She is oriented to person, place, and time. She appears well-developed and well-nourished. No distress.  HENT:  Head: Normocephalic and atraumatic.  Eyes: Conjunctivae normal are normal. No scleral icterus.  Neck: Normal range of motion.  Cardiovascular: Normal rate, regular rhythm and normal heart sounds.  Exam reveals no gallop and no friction rub.   No murmur heard. Pulmonary/Chest: Effort normal and breath sounds normal. No respiratory distress.  Abdominal: Soft. Bowel sounds are normal. She exhibits no distension and no mass. There is no tenderness. There is no guarding.       + R CVA ANGLE TENDERNESS   Musculoskeletal:       TTP THRA  Neurological: She is alert and oriented to person, place, and time.  Skin:  Skin is warm and dry. She is not diaphoretic.    ED Course  Procedures (including critical care time)  Labs Reviewed  URINALYSIS, ROUTINE W REFLEX MICROSCOPIC - Abnormal; Notable for the following:    APPearance CLOUDY (*)     All other components within normal limits  POCT PREGNANCY, URINE   Dg Thoracolumabar Spine  07/07/2012  *RADIOLOGY REPORT*  Clinical Data: back pain  THORACOLUMBAR SPINE - 2 VIEW  Comparison: None  Findings: Normal alignment of the lower thoracic and lumbar spine. The vertebral body heights are well preserved.  Disc spaces are intact.  No fracture or subluxation.  IMPRESSION:  1.  No acute  findings.   Original Report Authenticated By: Signa Kell, M.D.    Dg Abd 1 View  07/07/2012  *RADIOLOGY REPORT*  Clinical Data: Low back pain.  No injury.  ABDOMEN - 1 VIEW  Comparison: None.  Findings: There is a nonobstructive bowel gas pattern.  No supine evidence of free air.  No organomegaly or suspicious calcification.  No acute bony abnormality.  IMPRESSION: Unremarkable study.   Original Report Authenticated By: Charlett Nose, M.D.      1. Back pain       MDM  Patient with negative imaging. She does have musculoskeletal risk factors including obesity and large breasts.  I feel that this is likely musculoskeletal.  Will d/c swith mobic and ortho f/u.        Arthor Captain, PA-C 07/08/12 0222

## 2012-07-07 NOTE — ED Notes (Signed)
PT. REPORTS MID / RIGHT LOW BACK PAIN FOR SEVERAL DAYS , DENIES INJURY OR FALL , NO DYSURIA OR HEMATURIA.

## 2012-07-08 MED ORDER — MELOXICAM 15 MG PO TABS: 15.0000 mg | ORAL_TABLET | Freq: Every day | ORAL | Status: DC

## 2012-07-08 NOTE — ED Provider Notes (Signed)
Medical screening examination/treatment/procedure(s) were performed by non-physician practitioner and as supervising physician I was immediately available for consultation/collaboration.   Richardean Canal, MD 07/08/12 [phone]

## 2012-07-28 ENCOUNTER — Encounter: Payer: BC Managed Care – HMO | Admitting: Obstetrics and Gynecology

## 2012-12-11 ENCOUNTER — Encounter (HOSPITAL_COMMUNITY): Payer: Self-pay | Admitting: Emergency Medicine

## 2012-12-11 ENCOUNTER — Emergency Department (HOSPITAL_COMMUNITY): Payer: Self-pay

## 2012-12-11 ENCOUNTER — Emergency Department (HOSPITAL_COMMUNITY)
Admission: EM | Admit: 2012-12-11 | Discharge: 2012-12-11 | Disposition: A | Discharge status: Home / Self Care | Payer: Self-pay | Attending: Emergency Medicine | Admitting: Emergency Medicine

## 2012-12-11 DIAGNOSIS — R059 Cough, unspecified: Secondary | ICD-10-CM | POA: Insufficient documentation

## 2012-12-11 DIAGNOSIS — Z862 Personal history of diseases of the blood and blood-forming organs and certain disorders involving the immune mechanism: Secondary | ICD-10-CM | POA: Insufficient documentation

## 2012-12-11 DIAGNOSIS — Z8619 Personal history of other infectious and parasitic diseases: Secondary | ICD-10-CM | POA: Insufficient documentation

## 2012-12-11 DIAGNOSIS — J40 Bronchitis, not specified as acute or chronic: Secondary | ICD-10-CM

## 2012-12-11 DIAGNOSIS — J45901 Unspecified asthma with (acute) exacerbation: Secondary | ICD-10-CM | POA: Insufficient documentation

## 2012-12-11 DIAGNOSIS — H579 Unspecified disorder of eye and adnexa: Secondary | ICD-10-CM | POA: Insufficient documentation

## 2012-12-11 DIAGNOSIS — R0982 Postnasal drip: Secondary | ICD-10-CM | POA: Insufficient documentation

## 2012-12-11 DIAGNOSIS — Z8742 Personal history of other diseases of the female genital tract: Secondary | ICD-10-CM | POA: Insufficient documentation

## 2012-12-11 DIAGNOSIS — J3489 Other specified disorders of nose and nasal sinuses: Secondary | ICD-10-CM | POA: Insufficient documentation

## 2012-12-11 DIAGNOSIS — R0602 Shortness of breath: Secondary | ICD-10-CM | POA: Insufficient documentation

## 2012-12-11 DIAGNOSIS — R05 Cough: Secondary | ICD-10-CM | POA: Insufficient documentation

## 2012-12-11 DIAGNOSIS — R6889 Other general symptoms and signs: Secondary | ICD-10-CM | POA: Insufficient documentation

## 2012-12-11 LAB — COMPREHENSIVE METABOLIC PANEL
ALT: 12 U/L (ref 0–35)
AST: 21 U/L (ref 0–37)
Alkaline Phosphatase: 64 U/L (ref 39–117)
CO2: 28 mEq/L (ref 19–32)
Calcium: 9.3 mg/dL (ref 8.4–10.5)
Chloride: 108 mEq/L (ref 96–112)
GFR calc Af Amer: >90 mL/min (ref >90)
GFR calc non Af Amer: 80 mL/min — ABNORMAL LOW (ref >90)
Glucose, Bld: 98 mg/dL (ref 70–99)
Potassium: 3.9 mEq/L (ref 3.5–5.1)
Sodium: 143 mEq/L (ref 135–145)
Total Bilirubin: 0.3 mg/dL (ref 0.3–1.2)

## 2012-12-11 LAB — CBC WITH DIFFERENTIAL/PLATELET
Basophils Absolute: 0.1 K/uL (ref 0.0–0.1)
Basophils Relative: 1 % (ref 0–1)
Eosinophils Absolute: 0.4 K/uL (ref 0.0–0.7)
Eosinophils Relative: 5 % (ref 0–5)
HCT: 35.6 % — ABNORMAL LOW (ref 36.0–46.0)
Hemoglobin: 12.3 g/dL (ref 12.0–15.0)
Lymphocytes Relative: 30 % (ref 12–46)
Lymphs Abs: 2.5 10*3/uL (ref 0.7–4.0)
MCH: 30.8 pg (ref 26.0–34.0)
MCHC: 34.6 g/dL (ref 30.0–36.0)
MCV: 89 fL (ref 78.0–100.0)
Monocytes Absolute: 0.5 K/uL (ref 0.1–1.0)
Monocytes Relative: 6 % (ref 3–12)
Neutro Abs: 4.9 10*3/uL (ref 1.7–7.7)
Neutrophils Relative %: 58 % (ref 43–77)
Platelets: 273 10*3/uL (ref 150–400)
RBC: 4 MIL/uL (ref 3.87–5.11)
RDW: 12.4 % (ref 11.5–15.5)
WBC: 8.4 10*3/uL (ref 4.0–10.5)

## 2012-12-11 LAB — COMPREHENSIVE METABOLIC PANEL WITH GFR
Albumin: 3.7 g/dL (ref 3.5–5.2)
BUN: 9 mg/dL (ref 6–23)
Creatinine, Ser: 0.97 mg/dL (ref 0.50–1.10)
Total Protein: 7.4 g/dL (ref 6.0–8.3)

## 2012-12-11 MED ORDER — ALBUTEROL SULFATE (5 MG/ML) 0.5% IN NEBU
5.0000 mg | INHALATION_SOLUTION | Freq: Once | RESPIRATORY_TRACT | Status: AC
  Administered 2012-12-11: 5 mg via RESPIRATORY_TRACT
  Filled 2012-12-11: qty 1

## 2012-12-11 MED ORDER — PREDNISONE 20 MG PO TABS
60.0000 mg | ORAL_TABLET | Freq: Once | ORAL | Status: AC
  Administered 2012-12-11: 60 mg via ORAL
  Filled 2012-12-11: qty 3

## 2012-12-11 MED ORDER — PREDNISONE 10 MG PO TABS: 20.0000 mg | ORAL_TABLET | Freq: Every day | ORAL | Status: DC

## 2012-12-11 MED ORDER — IPRATROPIUM BROMIDE 0.02 % IN SOLN
0.5000 mg | Freq: Once | RESPIRATORY_TRACT | Status: AC
  Administered 2012-12-11: 0.5 mg via RESPIRATORY_TRACT
  Filled 2012-12-11: qty 2.5

## 2012-12-11 MED ORDER — ALBUTEROL SULFATE HFA 108 (90 BASE) MCG/ACT IN AERS
2.0000 | INHALATION_SPRAY | RESPIRATORY_TRACT | Status: DC | PRN
  Administered 2012-12-11: 2 via RESPIRATORY_TRACT
  Filled 2012-12-11: qty 6.7

## 2012-12-11 MED ORDER — GUAIFENESIN ER 600 MG PO TB12: 1200.0000 mg | ORAL_TABLET | Freq: Two times a day (BID) | ORAL | Status: DC

## 2012-12-11 NOTE — ED Notes (Signed)
Pt c/o pain in mid upper chest x's 2 days with shortness of breath.  Pt denies nausea or vomiting.  Denies cough

## 2012-12-11 NOTE — ED Provider Notes (Signed)
History     CSN: [id number]  Arrival date & time 12/11/12  0053   First MD Initiated Contact with Patient 12/11/12 [phone]      Chief Complaint  Patient presents with  . Chest Pain   HPI  History provided by the patient. Patient is a 27 year old female with past history of asthma who presents with complaints of worsening dry cough, shortness of breath and chest pain. Patient states symptoms first began earlier last week with some increased sneezing, rhinorrhea and itchy eyes. Patient felt that she was having worsening allergy symptoms began taking Zyrtec daily but about 2-3 days ago began having worsening dry cough with shortness of breath symptoms. Symptoms continued to gradually worsened and she also complains of central chest pain and discomfort. Pain is constant does not change with deep breath, cough or activity. Pain also is not changed with any movements. Patient has not used any treatments for her symptoms and stopped taking Zyrtec when she began having shortness of breath. She denies any other aggravating or alleviating factors. Denies any other associated symptoms. No fever, chills or sweats. No nausea, vomiting or diarrhea.    Past Medical History  Diagnosis Date  . Chlamydia   . Ovarian cyst 2011  . Anemia   . Asthma   . Abnormal pap 2007    Past Surgical History  Procedure Laterality Date  . Wisdom tooth extraction      Family History  Problem Relation Age of Onset  . Cancer Maternal Grandfather     prostate  . Cancer Brother   . Diabetes Maternal Aunt     History  Substance Use Topics  . Smoking status: Never Smoker   . Smokeless tobacco: Never Used  . Alcohol Use: Yes    OB History   Grav Para Term Preterm Abortions TAB SAB Ect Mult Living   2    2     0      Review of Systems  Constitutional: Negative for fever, chills and diaphoresis.  HENT: Positive for congestion, rhinorrhea, sneezing and postnasal drip. Negative for sore throat.   Eyes:  Positive for itching.  Respiratory: Positive for cough, shortness of breath and wheezing.   Cardiovascular: Positive for chest pain. Negative for palpitations.  Gastrointestinal: Negative for nausea, vomiting, abdominal pain and diarrhea.  Skin: Negative for rash.  All other systems reviewed and are negative.    Allergies  Review of patient's allergies indicates no known allergies.  Home Medications  No current outpatient prescriptions on file.  BP 119/83  Pulse 81  Temp(Src) 97.9 F (36.6 C) (Oral)  Resp 25  SpO2 96%  LMP 11/26/2012  Physical Exam  Nursing note and vitals reviewed. Constitutional: She is oriented to person, place, and time. She appears well-developed and well-nourished. No distress.  HENT:  Head: Normocephalic.  Right Ear: Tympanic membrane normal.  Left Ear: Tympanic membrane normal.  Mouth/Throat: Oropharynx is clear and moist.  Neck: Normal range of motion. Neck supple.  No meningeal signs  Cardiovascular: Normal rate and regular rhythm.   Pulmonary/Chest: No stridor. Tachypnea noted. She has wheezes. She has no rales.  Abdominal: Soft.  Musculoskeletal: Normal range of motion. She exhibits no edema and no tenderness.  No clinical signs concerning for DVT  Neurological: She is alert and oriented to person, place, and time.  Skin: Skin is warm and dry. No rash noted.  Psychiatric: She has a normal mood and affect. Her behavior is normal.    ED Course  Procedures   Results for orders placed during the hospital encounter of 12/11/12  CBC WITH DIFFERENTIAL      Result Value Range   WBC 8.4  4.0 - 10.5 K/uL   RBC 4.00  3.87 - 5.11 MIL/uL   Hemoglobin 12.3  12.0 - 15.0 g/dL   HCT 16.1 (*) 09.6 - 04.5 %   MCV 89.0  78.0 - 100.0 fL   MCH 30.8  26.0 - 34.0 pg   MCHC 34.6  30.0 - 36.0 g/dL   RDW 40.9  81.1 - 91.4 %   Platelets 273  150 - 400 K/uL   Neutrophils Relative 58  43 - 77 %   Neutro Abs 4.9  1.7 - 7.7 K/uL   Lymphocytes Relative 30  12 -  46 %   Lymphs Abs 2.5  0.7 - 4.0 K/uL   Monocytes Relative 6  3 - 12 %   Monocytes Absolute 0.5  0.1 - 1.0 K/uL   Eosinophils Relative 5  0 - 5 %   Eosinophils Absolute 0.4  0.0 - 0.7 K/uL   Basophils Relative 1  0 - 1 %   Basophils Absolute 0.1  0.0 - 0.1 K/uL  COMPREHENSIVE METABOLIC PANEL      Result Value Range   Sodium 143  135 - 145 mEq/L   Potassium 3.9  3.5 - 5.1 mEq/L   Chloride 108  96 - 112 mEq/L   CO2 28  19 - 32 mEq/L   Glucose, Bld 98  70 - 99 mg/dL   BUN 9  6 - 23 mg/dL   Creatinine, Ser 7.82  0.50 - 1.10 mg/dL   Calcium 9.3  8.4 - 95.6 mg/dL   Total Protein 7.4  6.0 - 8.3 g/dL   Albumin 3.7  3.5 - 5.2 g/dL   AST 21  0 - 37 U/L   ALT 12  0 - 35 U/L   Alkaline Phosphatase 64  39 - 117 U/L   Total Bilirubin 0.3  0.3 - 1.2 mg/dL   GFR calc non Af Amer 80 (*) >90 mL/min   GFR calc Af Amer >90  >90 mL/min  POCT I-STAT TROPONIN I      Result Value Range   Troponin i, poc 0.00  0.00 - 0.08 ng/mL   Comment 3                Dg Chest 2 View  12/11/2012  *RADIOLOGY REPORT*  Clinical Data: Central and left-sided chest pain; shortness of breath.  History of asthma.  CHEST - 2 VIEW  Comparison: Acromioclavicular joint films performed 12/05/2009  Findings: The lungs are well-aerated and clear.  There is no evidence of focal opacification, pleural effusion or pneumothorax.  The heart is normal in size; the mediastinal contour is within normal limits.  No acute osseous abnormalities are seen.  IMPRESSION: No acute cardiopulmonary process seen.   Original Report Authenticated By: Tonia Ghent, M.D.      1. Bronchitis       MDM  Patient seen and evaluated. Patient sitting comfortably appears in no acute distress. Slight tachypnea but normal O2 sats and heart rate. Patient is PERC negative.  Patient feeling much better after breathing treatments. Wheezing improved. She continues to appear well. Labs unremarkable. At this time patient stable for discharge home with  symptomatic treatment for bronchitis. She agrees with plan.     Date: 12/11/2012  Rate: 84  Rhythm: normal sinus rhythm  QRS Axis: normal  Intervals: normal  ST/T Wave abnormalities: normal  Conduction Disutrbances:none  Narrative Interpretation:   Old EKG Reviewed: none available     Angus Seller, PA-C 12/11/12 2007

## 2012-12-12 NOTE — ED Provider Notes (Signed)
Medical screening examination/treatment/procedure(s) were performed by non-physician practitioner and as supervising physician I was immediately available for consultation/collaboration.   Kiowa Hollar Y. Sereniti Wan, MD 12/12/12 0028 

## 2014-06-04 ENCOUNTER — Encounter (HOSPITAL_COMMUNITY): Payer: Self-pay | Admitting: Emergency Medicine

## 2014-06-04 DIAGNOSIS — Z8619 Personal history of other infectious and parasitic diseases: Secondary | ICD-10-CM | POA: Diagnosis not present

## 2014-06-04 DIAGNOSIS — Z7952 Long term (current) use of systemic steroids: Secondary | ICD-10-CM | POA: Insufficient documentation

## 2014-06-04 DIAGNOSIS — J45909 Unspecified asthma, uncomplicated: Secondary | ICD-10-CM | POA: Insufficient documentation

## 2014-06-04 DIAGNOSIS — Z79899 Other long term (current) drug therapy: Secondary | ICD-10-CM | POA: Diagnosis not present

## 2014-06-04 DIAGNOSIS — R1012 Left upper quadrant pain: Secondary | ICD-10-CM | POA: Diagnosis present

## 2014-06-04 DIAGNOSIS — Z862 Personal history of diseases of the blood and blood-forming organs and certain disorders involving the immune mechanism: Secondary | ICD-10-CM | POA: Diagnosis not present

## 2014-06-04 DIAGNOSIS — Z3202 Encounter for pregnancy test, result negative: Secondary | ICD-10-CM | POA: Insufficient documentation

## 2014-06-04 DIAGNOSIS — N3001 Acute cystitis with hematuria: Secondary | ICD-10-CM | POA: Insufficient documentation

## 2014-06-04 NOTE — ED Notes (Signed)
Approximately 2 1/2 hours ago she started with left side pain.  +nausea and vomiting.

## 2014-06-05 ENCOUNTER — Emergency Department (HOSPITAL_COMMUNITY)
Admission: EM | Admit: 2014-06-05 | Discharge: 2014-06-05 | Disposition: A | Discharge status: Home / Self Care | Payer: BC Managed Care – PPO | Attending: Emergency Medicine | Admitting: Emergency Medicine

## 2014-06-05 ENCOUNTER — Emergency Department (HOSPITAL_COMMUNITY): Payer: BC Managed Care – PPO

## 2014-06-05 DIAGNOSIS — N3001 Acute cystitis with hematuria: Secondary | ICD-10-CM

## 2014-06-05 LAB — URINE MICROSCOPIC-ADD ON

## 2014-06-05 LAB — COMPREHENSIVE METABOLIC PANEL
ALT: 13 U/L (ref 0–35)
AST: 19 U/L (ref 0–37)
Albumin: 3.5 g/dL (ref 3.5–5.2)
Alkaline Phosphatase: 60 U/L (ref 39–117)
Anion gap: 16 — ABNORMAL HIGH (ref 5–15)
BUN: 9 mg/dL (ref 6–23)
CALCIUM: 9.7 mg/dL (ref 8.4–10.5)
CO2: 20 meq/L (ref 19–32)
Chloride: 104 mEq/L (ref 96–112)
Creatinine, Ser: 0.93 mg/dL (ref 0.50–1.10)
GFR calc Af Amer: >90 mL/min (ref >90)
GFR, EST NON AFRICAN AMERICAN: 83 mL/min — AB (ref >90)
Glucose, Bld: 119 mg/dL — ABNORMAL HIGH (ref 70–99)
Potassium: 3.8 mEq/L (ref 3.7–5.3)
SODIUM: 140 meq/L (ref 137–147)
Total Bilirubin: 0.2 mg/dL — ABNORMAL LOW (ref 0.3–1.2)
Total Protein: 7.9 g/dL (ref 6.0–8.3)

## 2014-06-05 LAB — URINALYSIS, ROUTINE W REFLEX MICROSCOPIC
Bilirubin Urine: NEGATIVE
Glucose, UA: NEGATIVE mg/dL
Ketones, ur: NEGATIVE mg/dL
LEUKOCYTES UA: NEGATIVE
NITRITE: NEGATIVE
Protein, ur: NEGATIVE mg/dL
SPECIFIC GRAVITY, URINE: 1.025 (ref 1.005–1.030)
UROBILINOGEN UA: 0.2 mg/dL (ref 0.0–1.0)
pH: 7 (ref 5.0–8.0)

## 2014-06-05 LAB — CBC WITH DIFFERENTIAL/PLATELET
BASOS ABS: 0 10*3/uL (ref 0.0–0.1)
Basophils Relative: 0 % (ref 0–1)
EOS PCT: 2 % (ref 0–5)
Eosinophils Absolute: 0.2 10*3/uL (ref 0.0–0.7)
HCT: 36.7 % (ref 36.0–46.0)
Hemoglobin: 12.5 g/dL (ref 12.0–15.0)
LYMPHS PCT: 16 % (ref 12–46)
Lymphs Abs: 1.5 10*3/uL (ref 0.7–4.0)
MCH: 30.6 pg (ref 26.0–34.0)
MCHC: 34.1 g/dL (ref 30.0–36.0)
MCV: 90 fL (ref 78.0–100.0)
Monocytes Absolute: 0.4 10*3/uL (ref 0.1–1.0)
Monocytes Relative: 5 % (ref 3–12)
NEUTROS ABS: 7.1 10*3/uL (ref 1.7–7.7)
NEUTROS PCT: 77 % (ref 43–77)
PLATELETS: 277 10*3/uL (ref 150–400)
RBC: 4.08 MIL/uL (ref 3.87–5.11)
RDW: 12.4 % (ref 11.5–15.5)
WBC: 9.2 10*3/uL (ref 4.0–10.5)

## 2014-06-05 LAB — PREGNANCY, URINE: PREG TEST UR: NEGATIVE

## 2014-06-05 MED ORDER — SODIUM CHLORIDE 0.9 % IV BOLUS (SEPSIS)
1000.0000 mL | Freq: Once | INTRAVENOUS | Status: AC
  Administered 2014-06-05: 1000 mL via INTRAVENOUS

## 2014-06-05 MED ORDER — ACETAMINOPHEN 500 MG PO TABS
1000.0000 mg | ORAL_TABLET | Freq: Once | ORAL | Status: AC
  Administered 2014-06-05: 1000 mg via ORAL
  Filled 2014-06-05: qty 2

## 2014-06-05 MED ORDER — MORPHINE SULFATE 4 MG/ML IJ SOLN
6.0000 mg | Freq: Once | INTRAMUSCULAR | Status: AC
  Administered 2014-06-05: 6 mg via INTRAVENOUS
  Filled 2014-06-05: qty 2

## 2014-06-05 MED ORDER — CEPHALEXIN 250 MG PO CAPS
500.0000 mg | ORAL_CAPSULE | Freq: Once | ORAL | Status: AC
  Administered 2014-06-05: 500 mg via ORAL
  Filled 2014-06-05: qty 2

## 2014-06-05 MED ORDER — ONDANSETRON HCL 4 MG/2ML IJ SOLN
4.0000 mg | Freq: Once | INTRAMUSCULAR | Status: AC
  Administered 2014-06-05: 4 mg via INTRAVENOUS
  Filled 2014-06-05: qty 2

## 2014-06-05 MED ORDER — CEPHALEXIN 500 MG PO CAPS: 500.0000 mg | ORAL_CAPSULE | Freq: Two times a day (BID) | ORAL | Status: AC

## 2014-06-05 NOTE — ED Provider Notes (Signed)
CSN: [id number]     Arrival date & time 06/04/14  2253 History   First MD Initiated Contact with Patient 06/05/14 0126     Chief Complaint  Patient presents with  . left side pain      (Consider location/radiation/quality/duration/timing/severity/associated sxs/prior Treatment) HPI Denise Roberts is a 28 y.o. female with no significant past medical history coming in with left-sided pain. Patient states his occurred around 10 PM tonight. It is located in her left upper quadrant and it as stabbing in sensation. She states it intermittently feels like someone is twisting. She denies ever having this in the past. She also admits to tingling in her vaginal area, she denies this being pain. She has been nauseous as well and vomited 3 times in route to the emergency department. She denies dysuria or hematuria. There is no vaginal bleeding or vaginal discharge. She admits to chills but no fevers, diaphoresis, or recent infections. She has no history of nephrolithiasis nor is it in her family. She states laying flat makes her pain worse, and she took a Motrin which did not improve her symptoms. Patient has no chest pain or shortness of breath, she has no further complaints.  10 Systems reviewed and are negative for acute change except as noted in the HPI.     Past Medical History  Diagnosis Date  . Chlamydia   . Ovarian cyst 2011  . Anemia   . Asthma   . Abnormal pap 2007   Past Surgical History  Procedure Laterality Date  . Wisdom tooth extraction     Family History  Problem Relation Age of Onset  . Cancer Maternal Grandfather     prostate  . Cancer Brother   . Diabetes Maternal Aunt    History  Substance Use Topics  . Smoking status: Never Smoker   . Smokeless tobacco: Never Used  . Alcohol Use: Yes   OB History   Grav Para Term Preterm Abortions TAB SAB Ect Mult Living   2    2     0     Review of Systems    Allergies  Review of patient's allergies indicates no known  allergies.  Home Medications   Prior to Admission medications   Medication Sig Start Date End Date Taking? Authorizing Provider  guaiFENesin (MUCINEX) 600 MG 12 hr tablet Take 2 tablets (1,200 mg total) by mouth 2 (two) times daily. 12/11/12   Phill MutterPeter S Dammen, PA-C  predniSONE (DELTASONE) 10 MG tablet Take 2 tablets (20 mg total) by mouth daily. 12/11/12   Phill MutterPeter S Dammen, PA-C   BP 113/59  Pulse 72  Temp(Src) 98.4 F (36.9 C) (Oral)  Resp 14  Ht 5\' 3"  (1.6 m)  Wt 240 lb (108.863 kg)  BMI 42.52 kg/m2  SpO2 100%  LMP 05/28/2014 Physical Exam  Nursing note and vitals reviewed. Constitutional: She is oriented to person, place, and time. She appears well-developed and well-nourished. No distress.  HENT:  Head: Normocephalic and atraumatic.  Nose: Nose normal.  Mouth/Throat: Oropharynx is clear and moist. No oropharyngeal exudate.  Eyes: Conjunctivae and EOM are normal. Pupils are equal, round, and reactive to light. No scleral icterus.  Neck: Normal range of motion. Neck supple. No JVD present. No tracheal deviation present. No thyromegaly present.  Cardiovascular: Normal rate, regular rhythm and normal heart sounds.  Exam reveals no gallop and no friction rub.   No murmur heard. Pulmonary/Chest: Effort normal and breath sounds normal. No respiratory distress. She has  no wheezes. She exhibits no tenderness.  Abdominal: Soft. Bowel sounds are normal. She exhibits no distension and no mass. There is tenderness. There is no rebound and no guarding.  Left upper quadrant tenderness to palpation. No CVA tenderness. No lower abdominal tenderness.  Musculoskeletal: Normal range of motion. She exhibits no edema and no tenderness.  Lymphadenopathy:    She has no cervical adenopathy.  Neurological: She is alert and oriented to person, place, and time. No cranial nerve deficit. She exhibits normal muscle tone.  Skin: Skin is warm and dry. No rash noted. She is not diaphoretic. No erythema. No  pallor.    ED Course  Procedures (including critical care time) Labs Review Labs Reviewed  URINALYSIS, ROUTINE W REFLEX MICROSCOPIC - Abnormal; Notable for the following:    APPearance CLOUDY (*)    Hgb urine dipstick LARGE (*)    All other components within normal limits  COMPREHENSIVE METABOLIC PANEL - Abnormal; Notable for the following:    Glucose, Bld 119 (*)    Total Bilirubin 0.2 (*)    GFR calc non Af Amer 83 (*)    Anion gap 16 (*)    All other components within normal limits  URINE MICROSCOPIC-ADD ON - Abnormal; Notable for the following:    Squamous Epithelial / LPF FEW (*)    Bacteria, UA MANY (*)    All other components within normal limits  PREGNANCY, URINE  CBC WITH DIFFERENTIAL    Imaging Review [facility]  06/05/2014   CLINICAL DATA:  Evaluate for LEFT kidney stone and/or hydronephrosis. Acute symptoms.  EXAM: RENAL/URINARY TRACT ULTRASOUND COMPLETE  COMPARISON:  None.  FINDINGS: Right Kidney:  Length: 10.6 cm. Echogenicity within normal limits. No mass or hydronephrosis visualized.  Left Kidney:  Length: 10.7 cm. Echogenicity within normal limits. No mass or hydronephrosis visualized.  Bladder:  Appears normal for degree of bladder distention.  IMPRESSION: Normal renal ultrasound.   Electronically Signed   By: Awilda Metroourtnay  Bloomer   On: 06/05/2014 02:30     EKG Interpretation None      MDM   Final diagnoses:  None    Patient was seen emergency department out of concern for left side and left upper quadrant pain. She'll be evaluated for kidney stones with renal ultrasound. She was given IV fluids morphine and Tylenol for pain control. She did take Motrin at home.  Patient's pain has improved on my repeat evaluation of her. Renal ultrasound did not show any hydronephrosis. Urinalysis shows a mild UTI with many bacteria, few squamous epithelial, and large hemoglobin. She'll be daily with a short three-day course of Keflex. She was given diet changes to avoid  repeat kidney stone. She is advised to followup with a primary care physician. Vital signs remain within her normal limits and she is safe for discharge.    Tomasita CrumbleAdeleke Kelsay Haggard, MD 06/05/14 [phone]

## 2014-06-05 NOTE — Discharge Instructions (Signed)
Urinary Tract Infection Ms. Denise HansenCarey, you were seen in emergency department for side pain. Your ultrasound did not show any swelling of the kidney. Your urine didn't show an infection. Take antibiotics as prescribed and followup with a primary care physician within 3 days. Return to emergency department for any worsening. Thank you. A urinary tract infection (UTI) can occur any place along the urinary tract. The tract includes the kidneys, ureters, bladder, and urethra. A type of germ called bacteria often causes a UTI. UTIs are often helped with antibiotic medicine.  HOME CARE   If given, take antibiotics as told by your doctor. Finish them even if you start to feel better.  Drink enough fluids to keep your pee (urine) clear or pale yellow.  Avoid tea, drinks with caffeine, and bubbly (carbonated) drinks.  Pee often. Avoid holding your pee in for a long time.  Pee before and after having sex (intercourse).  Wipe from front to back after you poop (bowel movement) if you are a woman. Use each tissue only once. GET HELP RIGHT AWAY IF:   You have back pain.  You have lower belly (abdominal) pain.  You have chills.  You feel sick to your stomach (nauseous).  You throw up (vomit).  Your burning or discomfort with peeing does not go away.  You have a fever.  Your symptoms are not better in 3 days. MAKE SURE YOU:   Understand these instructions.  Will watch your condition.  Will get help right away if you are not doing well or get worse. Document Released: 01/28/2008 Document Revised: 05/05/2012 Document Reviewed: 03/11/2012 [facility] Patient Information 2015 [place], [place]. This information is not intended to replace advice given to you by your health care provider. Make sure you discuss any questions you have with your health care provider. Kidney Stones Kidney stones (urolithiasis) are solid masses that form inside your kidneys. The intense pain is caused by the stone moving  through the kidney, ureter, bladder, and urethra (urinary tract). When the stone moves, the ureter starts to spasm around the stone. The stone is usually passed in your pee (urine).  HOME CARE  Drink enough fluids to keep your pee clear or pale yellow. This helps to get the stone out.  Strain all pee through the provided strainer. Do not pee without peeing through the strainer, not even once. If you pee the stone out, catch it in the strainer. The stone may be as small as a grain of salt. Take this to your doctor. This will help your doctor figure out what you can do to try to prevent more kidney stones.  Only take medicine as told by your doctor.  Follow up with your doctor as told.  Get follow-up X-rays as told by your doctor. GET HELP IF: You have pain that gets worse even if you have been taking pain medicine. GET HELP RIGHT AWAY IF:   Your pain does not get better with medicine.  You have a fever or shaking chills.  Your pain increases and gets worse over 18 hours.  You have new belly (abdominal) pain.  You feel faint or pass out.  You are unable to pee. MAKE SURE YOU:   Understand these instructions.  Will watch your condition.  Will get help right away if you are not doing well or get worse. Document Released: 01/28/2008 Document Revised: 04/13/2013 Document Reviewed: 01/12/2013 [facility] Patient Information 2015 [place], [place]. This information is not intended to replace advice given to you  by your health care provider. Make sure you discuss any questions you have with your health care provider.  Emergency Department Resource Guide 1) Find a Doctor and Pay Out of Pocket Although you won't have to find out who is covered by your insurance plan, it is a good idea to ask around and get recommendations. You will then need to call the office and see if the doctor you have chosen will accept you as a new patient and what types of options they offer for patients who are  self-pay. Some doctors offer discounts or will set up payment plans for their patients who do not have insurance, but you will need to ask so you aren't surprised when you get to your appointment.  2) Contact Your Local Health Department Not all health departments have doctors that can see patients for sick visits, but many do, so it is worth a call to see if yours does. If you don't know where your local health department is, you can check in your phone book. The CDC also has a tool to help you locate your state's health department, and many state websites also have listings of all of their local health departments.  3) Find a Walk-in Clinic If your illness is not likely to be very severe or complicated, you may want to try a walk in clinic. These are popping up all over the country in pharmacies, drugstores, and shopping centers. They're usually staffed by nurse practitioners or physician assistants that have been trained to treat common illnesses and complaints. They're usually fairly quick and inexpensive. However, if you have serious medical issues or chronic medical problems, these are probably not your best option.  No Primary Care Doctor: - Call Health Connect at  [phone] - they can help you locate a primary care doctor that  accepts your insurance, provides certain services, etc. - Physician Referral Service- [phone]  Chronic Pain Problems: Organization         Address  Phone   Notes  [facility]  [phone] Patients need to be referred by their primary care doctor.   Medication Assistance: Organization         Address  Phone   Notes  [facility] Medication [facility] [address] [place] [place], [place] [address] [phone] --Must be a resident of [facility] -- Must have NO insurance coverage whatsoever (no Medicaid/ Medicare, etc.) -- The pt. MUST have a primary care doctor that directs their care regularly and follows them in  the community   [facility]ist  [phone]   [employer]  [phone]    Agencies that provide inexpensive medical care: Organization         Address  Phone   Notes  [facility]  [phone]   [facility]    [phone]   [facility] [address] [place], [place] [address] [phone]   [facility] [address] [place], [place] [phone]   Planned Parenthood    [phone]   [facility]    [phone]   Community Health and [facility]  [address], [place] Phone:  [phone], Fax:  [phone] Hours of Operation:  9 am - 6 pm, M-F.  Also accepts Medicaid/Medicare and self-pay.  [facility]  [address], [place] Phone: [phone], Fax: [phone]. Hours  of Operation:  8:30 am - 5:30 pm, M-F.  Also accepts Medicaid and self-pay.  [facility] [place] [address] [place] Phone: [phone]   [facility] [address] Natasha Bence [place], [place] [phone], Ext. [phone] Mondays & Thursdays: 7-9 AM.  First 15 patients are seen on a first come, first serve basis.    Medicaid-accepting [facility] Providers:  Organization         Address  Phone   Notes  [facility] [address], [place] [phone] Also accepts self-pay patients.  [facility] [address] Laurell Josephs [place], [place]  [phone]   [facility] [address], [place] [phone]   [facility] [address], [place] [phone]   Renaye Rakers [address], [place]   [phone] Only accepts [place] patients after they have their name applied to their card.   Self-Pay (no insurance) in [facility]:  Organization         Address  Phone   Notes  Sickle Cell Patients,  [facility] [address] [place], [place] [phone]   [facility] [address] [place], [place] [phone]   [facility] [place]  [address], [place] [phone]   [facility]/Dr. Osei-Bonsu  [address], [place] or [address], [place] [phone] Phone number for both [place] and [place] locations is the same.  Urgent Medical and [facility] [address] [place] [phone]   [facility] [address] [place] or [address] [phone]   [facility] [address] [place] [phone], phone; [phone], fax Sees patients 1st and 3rd Saturday of every month.  Must not qualify for public or private insurance (i.e. Medicaid, Medicare, [place] Health Choice, Veterans' Benefits)  Household income should be no more than 200% of the poverty level The clinic cannot treat you if you are pregnant or think you are pregnant  Sexually transmitted diseases are not treated at the clinic.    Dental Care: Organization         Address  Phone  Notes  [facility] of [facility] [address] [place], [place] [phone] Accepts children up to age 70 who are enrolled in [place] or [place] Health Choice; pregnant women with a Medicaid card; and children who have applied for Medicaid or [place] Health Choice, but were declined, whose parents can pay a reduced fee at time of service.  [facility] Department of [facility]  [address], [place] [phone] Accepts children up to age 49 who are enrolled in [place] or [place] Health Choice; pregnant women with a Medicaid card; and children who have applied for Medicaid or [place] Health Choice, but were declined, whose parents can pay a reduced fee at time of service.  [facility]   [address] [place], [place] [phone] Patients are seen by appointment only. Walk-ins are not accepted. [facility] will see patients 64 years of age and older. Monday - Tuesday (8am-5pm) Most Wednesdays (8:30-5pm) $30 per visit, cash only  [facility] Adult Dental Access PROGRAM  [address], [place] [phone] Patients are  seen by appointment only. Walk-ins are not accepted. [facility] will see patients 24 years of age and older. One Wednesday Evening (Monthly: Volunteer Based).  $30 per visit, cash only  [employer]  [phone] for adults; Children under age 64, call Graduate Pediatric Dentistry at [phone]. Children aged 70-14, please call [phone] to request a pediatric application.  Dental services are provided in all areas of dental care including fillings, crowns and bridges, complete and partial dentures, implants, gum treatment, root canals, and extractions. Preventive care is also provided. Treatment is provided to both adults and children. Patients are selected via a lottery and there is often a waiting list.   [facility] [address] [place]  [phone] [email or web address]   [facility] [address] [place], [place] [phone], Ext. [phone] Second and Fourth Thursday of each month, opens at 6:30 AM; Clinic ends at 9 AM.  Patients are seen on a first-come first-served basis, and a limited number are seen during each clinic.   [facility]  [address] Ether Griffins [place], [place] [phone]   Eligibility Requirements You must have lived in [place], [place], or [place] counties for at least the last three months.   You cannot be eligible for state or federal sponsored [place], including [employer], [place], or [employer].   You generally cannot be eligible for healthcare insurance through your employer.    How to apply: Eligibility screenings are  held every Tuesday and Wednesday afternoon from 1:00 pm until 4:00 pm. You do not need an appointment for the interview!  [facility] [address], [place], [place] [phone]   [facility] Health Department  [phone]   [facility] Health Department  [phone]   [facility] Health Department  [phone]    Behavioral Health Resources in the Community: Intensive Outpatient Programs Organization         Address  Phone  Notes  [facility] [address] [place], [place] [phone]   [facility] Outpatient [address] [place], [place] [phone]   [facility] [address], [place], [place]  [phone]   [facility] Mental Health [address],  [place], [place] [phone] or [phone]   Substance Abuse Resources Organization         Address  Phone  Notes  [facility]  [phone]   [facility]  [phone]   [facility]  [phone]   Floydene Flock  [phone]   Residential & Outpatient Substance Abuse Program  [phone]   Psychological Services Organization         Address  Phone  Notes  [facility] Behavioral Health  [phone]   [facility]  [phone]   [facility] Mental Health [address] [place] [phone] or [phone]    Mobile Crisis Teams Organization         Address  Phone  Notes  [facility], Mobile Crisis Care Unit  [phone]   [facility]  [address] [place], [place] [phone]   Doristine Locks [address] [place] [phone]    Self-Help/Support Groups Organization         Address  Phone             Notes  [facility] [place] - variety of support groups  336- I7437963 Call for more  information  Narcotics Anonymous (NA), [facility] [address], [place]  2 meetings at this location     Residential Sports administratorTreatment Programs Organization         Address  Phone  Notes  [facility] [address],    [place]  [phone]   [facility]  [address], [place] [phone], [place], [place] [phone]   [facility] [address], [place] [phone] Admissions: 8am-3pm M-F  [facility] [address]    [place], [place] [phone]   [facility] [address], [place], [place] [phone]   [facility] [address]  [place], [place] [phone]   [facility] - Intensive Outpatient [address], [place], [place] [phone]   [facility] ([facility] [address]  [place], [place] [phone] or [phone]   [facility] ([facility]) [address] [place], [place] [phone] Accepts Medicaid  [facility] [address]  [place] [phone] Substance Abuse/Addiction Treatment   [facility] Behavioral Health Resources Organization         Address  Phone  Notes  [facility]  [phone]) [phone]   Angie FavaJulie Brannon, PhD [address] [place], [place]   [phone] or [phone](336) [phone]   [facility]   [address] [place], [place] [phone]) [phone]   [facility] [address], [place], [place] [phone]) [phone] Insurance/Medicaid/sponsorship through [facility]  [facility] [address]                                    [place], [place] [phone]) [phone] Therapy/tele-psych/case  [facility] [address]   [place], [place] [phone]) [phone]    Dr. Lolly MustacheArfeen  [phone]) [phone]   [facility]  [employer] [facility] 1) [address], [place] 2) [address], [place] 3)  [address], [place] [phone](336) [phone](336) [phone]  [phone]) [phone]   [facility] [place] Child Abuse Hotline [phone]) [phone] or [phone](336) [phone] (After  Hours)

## 2014-06-26 ENCOUNTER — Encounter (HOSPITAL_COMMUNITY): Payer: Self-pay | Admitting: Emergency Medicine

## 2014-09-26 ENCOUNTER — Ambulatory Visit
Admission: RE | Admit: 2014-09-26 | Discharge: 2014-09-26 | Disposition: A | Discharge status: Home / Self Care | Payer: BLUE CROSS/BLUE SHIELD | Source: Ambulatory Visit | Attending: Emergency Medicine | Admitting: Emergency Medicine

## 2014-09-26 ENCOUNTER — Other Ambulatory Visit: Payer: Self-pay | Admitting: Emergency Medicine

## 2014-09-26 DIAGNOSIS — M79672 Pain in left foot: Secondary | ICD-10-CM

## 2014-10-07 ENCOUNTER — Emergency Department (HOSPITAL_COMMUNITY)
Admission: EM | Admit: 2014-10-07 | Discharge: 2014-10-07 | Disposition: A | Discharge status: Home / Self Care | Payer: BLUE CROSS/BLUE SHIELD | Attending: Emergency Medicine | Admitting: Emergency Medicine

## 2014-10-07 ENCOUNTER — Emergency Department (HOSPITAL_COMMUNITY): Payer: BLUE CROSS/BLUE SHIELD

## 2014-10-07 ENCOUNTER — Encounter (HOSPITAL_COMMUNITY): Payer: Self-pay | Admitting: Emergency Medicine

## 2014-10-07 DIAGNOSIS — R0789 Other chest pain: Secondary | ICD-10-CM | POA: Diagnosis not present

## 2014-10-07 DIAGNOSIS — J069 Acute upper respiratory infection, unspecified: Secondary | ICD-10-CM | POA: Diagnosis not present

## 2014-10-07 DIAGNOSIS — R5383 Other fatigue: Secondary | ICD-10-CM | POA: Diagnosis not present

## 2014-10-07 DIAGNOSIS — Z8619 Personal history of other infectious and parasitic diseases: Secondary | ICD-10-CM | POA: Insufficient documentation

## 2014-10-07 DIAGNOSIS — Z79899 Other long term (current) drug therapy: Secondary | ICD-10-CM | POA: Insufficient documentation

## 2014-10-07 DIAGNOSIS — Z792 Long term (current) use of antibiotics: Secondary | ICD-10-CM | POA: Insufficient documentation

## 2014-10-07 DIAGNOSIS — R51 Headache: Secondary | ICD-10-CM | POA: Insufficient documentation

## 2014-10-07 DIAGNOSIS — R0602 Shortness of breath: Secondary | ICD-10-CM | POA: Diagnosis present

## 2014-10-07 DIAGNOSIS — J209 Acute bronchitis, unspecified: Secondary | ICD-10-CM

## 2014-10-07 DIAGNOSIS — J45901 Unspecified asthma with (acute) exacerbation: Secondary | ICD-10-CM | POA: Insufficient documentation

## 2014-10-07 LAB — BASIC METABOLIC PANEL
Anion gap: 8 (ref 5–15)
BUN: 5 mg/dL — AB (ref 6–23)
CO2: 23 mmol/L (ref 19–32)
Calcium: 8.9 mg/dL (ref 8.4–10.5)
Chloride: 107 mmol/L (ref 96–112)
Creatinine, Ser: 0.88 mg/dL (ref 0.50–1.10)
GFR calc Af Amer: >90 mL/min (ref >90)
GFR, EST NON AFRICAN AMERICAN: 89 mL/min — AB (ref >90)
Glucose, Bld: 108 mg/dL — ABNORMAL HIGH (ref 70–99)
Potassium: 3.7 mmol/L (ref 3.5–5.1)
SODIUM: 138 mmol/L (ref 135–145)

## 2014-10-07 LAB — I-STAT TROPONIN, ED: TROPONIN I, POC: 0 ng/mL (ref 0.00–0.08)

## 2014-10-07 LAB — CBC
HCT: 38.8 % (ref 36.0–46.0)
Hemoglobin: 12.7 g/dL (ref 12.0–15.0)
MCH: 30.3 pg (ref 26.0–34.0)
MCHC: 32.7 g/dL (ref 30.0–36.0)
MCV: 92.6 fL (ref 78.0–100.0)
Platelets: 241 10*3/uL (ref 150–400)
RBC: 4.19 MIL/uL (ref 3.87–5.11)
RDW: 12.5 % (ref 11.5–15.5)
WBC: 4.8 10*3/uL (ref 4.0–10.5)

## 2014-10-07 MED ORDER — ALBUTEROL SULFATE HFA 108 (90 BASE) MCG/ACT IN AERS
2.0000 | INHALATION_SPRAY | Freq: Once | RESPIRATORY_TRACT | Status: AC
  Administered 2014-10-07: 2 via RESPIRATORY_TRACT
  Filled 2014-10-07: qty 6.7

## 2014-10-07 MED ORDER — AEROCHAMBER PLUS W/MASK MISC
1.0000 | Freq: Once | Status: AC
  Administered 2014-10-07: 1
  Filled 2014-10-07: qty 1

## 2014-10-07 MED ORDER — PREDNISONE 20 MG PO TABS
60.0000 mg | ORAL_TABLET | Freq: Once | ORAL | Status: AC
  Administered 2014-10-07: 60 mg via ORAL
  Filled 2014-10-07: qty 3

## 2014-10-07 MED ORDER — FLUTICASONE PROPIONATE 50 MCG/ACT NA SUSP: 2.0000 | Freq: Every day | NASAL | Status: AC

## 2014-10-07 MED ORDER — BENZONATATE 100 MG PO CAPS: 100.0000 mg | ORAL_CAPSULE | Freq: Three times a day (TID) | ORAL | Status: AC

## 2014-10-07 MED ORDER — OXYMETAZOLINE HCL 0.05 % NA SOLN
1.0000 | Freq: Once | NASAL | Status: AC
  Administered 2014-10-07: 1 via NASAL
  Filled 2014-10-07: qty 15

## 2014-10-07 MED ORDER — ALBUTEROL SULFATE (2.5 MG/3ML) 0.083% IN NEBU
5.0000 mg | INHALATION_SOLUTION | Freq: Once | RESPIRATORY_TRACT | Status: AC
  Administered 2014-10-07: 5 mg via RESPIRATORY_TRACT
  Filled 2014-10-07: qty 6

## 2014-10-07 MED ORDER — PREDNISONE 20 MG PO TABS: 40.0000 mg | ORAL_TABLET | Freq: Every day | ORAL | Status: AC

## 2014-10-07 NOTE — Discharge Instructions (Signed)
1. Medications: flonase, mucinex, tessalon, prednisone, usual home medications 2. Treatment: rest, drink plenty of fluids, take tylenol or ibuprofen for fever control 3. Follow Up: Please followup with your primary doctor in 3 days for discussion of your diagnoses and further evaluation after today's visit; if you do not have a primary care doctor use the resource guide provided to find one; Return to the ER for high fevers, difficulty breathing or other concerning symptoms    Upper Respiratory Infection, Adult An upper respiratory infection (URI) is also sometimes known as the common cold. The upper respiratory tract includes the nose, sinuses, throat, trachea, and bronchi. Bronchi are the airways leading to the lungs. Most people improve within 1 week, but symptoms can last up to 2 weeks. A residual cough may last even longer.  CAUSES Many different viruses can infect the tissues lining the upper respiratory tract. The tissues become irritated and inflamed and often become very moist. Mucus production is also common. A cold is contagious. You can easily spread the virus to others by oral contact. This includes kissing, sharing a glass, coughing, or sneezing. Touching your mouth or nose and then touching a surface, which is then touched by another person, can also spread the virus. SYMPTOMS  Symptoms typically develop 1 to 3 days after you come in contact with a cold virus. Symptoms vary from person to person. They may include:  Runny nose.  Sneezing.  Nasal congestion.  Sinus irritation.  Sore throat.  Loss of voice (laryngitis).  Cough.  Fatigue.  Muscle aches.  Loss of appetite.  Headache.  Low-grade fever. DIAGNOSIS  You might diagnose your own cold based on familiar symptoms, since most people get a cold 2 to 3 times a year. Your caregiver can confirm this based on your exam. Most importantly, your caregiver can check that your symptoms are not due to another disease such  as strep throat, sinusitis, pneumonia, asthma, or epiglottitis. Blood tests, throat tests, and X-rays are not necessary to diagnose a common cold, but they may sometimes be helpful in excluding other more serious diseases. Your caregiver will decide if any further tests are required. RISKS AND COMPLICATIONS  You may be at risk for a more severe case of the common cold if you smoke cigarettes, have chronic heart disease (such as heart failure) or lung disease (such as asthma), or if you have a weakened immune system. The very young and very old are also at risk for more serious infections. Bacterial sinusitis, middle ear infections, and bacterial pneumonia can complicate the common cold. The common cold can worsen asthma and chronic obstructive pulmonary disease (COPD). Sometimes, these complications can require emergency medical care and may be life-threatening. PREVENTION  The best way to protect against getting a cold is to practice good hygiene. Avoid oral or hand contact with people with cold symptoms. Wash your hands often if contact occurs. There is no clear evidence that vitamin C, vitamin E, echinacea, or exercise reduces the chance of developing a cold. However, it is always recommended to get plenty of rest and practice good nutrition. TREATMENT  Treatment is directed at relieving symptoms. There is no cure. Antibiotics are not effective, because the infection is caused by a virus, not by bacteria. Treatment may include:  Increased fluid intake. Sports drinks offer valuable electrolytes, sugars, and fluids.  Breathing heated mist or steam (vaporizer or shower).  Eating chicken soup or other clear broths, and maintaining good nutrition.  Getting plenty of rest.  Using gargles or lozenges for comfort.  Controlling fevers with ibuprofen or acetaminophen as directed by your caregiver.  Increasing usage of your inhaler if you have asthma. Zinc gel and zinc lozenges, taken in the first 24  hours of the common cold, can shorten the duration and lessen the severity of symptoms. Pain medicines may help with fever, muscle aches, and throat pain. A variety of non-prescription medicines are available to treat congestion and runny nose. Your caregiver can make recommendations and may suggest nasal or lung inhalers for other symptoms.  HOME CARE INSTRUCTIONS   Only take over-the-counter or prescription medicines for pain, discomfort, or fever as directed by your caregiver.  Use a warm mist humidifier or inhale steam from a shower to increase air moisture. This may keep secretions moist and make it easier to breathe.  Drink enough water and fluids to keep your urine clear or pale yellow.  Rest as needed.  Return to work when your temperature has returned to normal or as your caregiver advises. You may need to stay home longer to avoid infecting others. You can also use a face mask and careful hand washing to prevent spread of the virus. SEEK MEDICAL CARE IF:   After the first few days, you feel you are getting worse rather than better.  You need your caregiver's advice about medicines to control symptoms.  You develop chills, worsening shortness of breath, or brown or red sputum. These may be signs of pneumonia.  You develop yellow or brown nasal discharge or pain in the face, especially when you bend forward. These may be signs of sinusitis.  You develop a fever, swollen neck glands, pain with swallowing, or white areas in the back of your throat. These may be signs of strep throat. SEEK IMMEDIATE MEDICAL CARE IF:   You have a fever.  You develop severe or persistent headache, ear pain, sinus pain, or chest pain.  You develop wheezing, a prolonged cough, cough up blood, or have a change in your usual mucus (if you have chronic lung disease).  You develop sore muscles or a stiff neck. Document Released: 02/04/2001 Document Revised: 11/03/2011 Document Reviewed:  11/16/2013 [facility] Patient Information 2015 [place], [place]. This information is not intended to replace advice given to you by your health care provider. Make sure you discuss any questions you have with your health care provider.    Emergency Department Resource Guide 1) Find a Doctor and Pay Out of Pocket Although you won't have to find out who is covered by your insurance plan, it is a good idea to ask around and get recommendations. You will then need to call the office and see if the doctor you have chosen will accept you as a new patient and what types of options they offer for patients who are self-pay. Some doctors offer discounts or will set up payment plans for their patients who do not have insurance, but you will need to ask so you aren't surprised when you get to your appointment.  2) Contact Your Local Health Department Not all health departments have doctors that can see patients for sick visits, but many do, so it is worth a call to see if yours does. If you don't know where your local health department is, you can check in your phone book. The CDC also has a tool to help you locate your state's health department, and many state websites also have listings of all of their local health departments.  3) Find  a Walk-in Clinic If your illness is not likely to be very severe or complicated, you may want to try a walk in clinic. These are popping up all over the country in pharmacies, drugstores, and shopping centers. They're usually staffed by nurse practitioners or physician assistants that have been trained to treat common illnesses and complaints. They're usually fairly quick and inexpensive. However, if you have serious medical issues or chronic medical problems, these are probably not your best option.  No Primary Care Doctor: - Call Health Connect at  [phone] - they can help you locate a primary care doctor that  accepts your insurance, provides certain services, etc. - Physician  Referral Service- [phone]  Chronic Pain Problems: Organization         Address  Phone   Notes  [facility]  [phone] Patients need to be referred by their primary care doctor.   Medication Assistance: Organization         Address  Phone   Notes  [facility] Medication [facility] [address] [place], [place] [address] [phone] --Must be a resident of [facility] -- Must have NO insurance coverage whatsoever (no Medicaid/ Medicare, etc.) -- The pt. MUST have a primary care doctor that directs their care regularly and follows them in the community   [facility]  [phone]   [employer]  [phone]    Agencies that provide inexpensive medical care: Organization         Address  Phone   Notes  [facility]  [phone]   [facility]    [phone]   [facility] [address] [place], [place] [address] [phone]   [facility] [address] [place] [phone]   Planned Parenthood    [phone]   [facility]    [phone]   [facility]  [address], [place] Phone:  [phone], Fax:  [phone] Hours of Operation:  9 am - 6 pm, M-F.  Also accepts Medicaid/Medicare and self-pay.  [facility]  [address], [place] Phone: [phone], Fax: [phone]. Hours of Operation:  8:30 am - 5:30 pm, M-F.  Also accepts Medicaid and self-pay.  [facility] [place] [address] [place] Phone: [phone]   [facility] [address] Natasha Bence [place], [place] [phone], Ext. [phone] Mondays & Thursdays: 7-9 AM.  First 15 patients are seen on a first come, first serve basis.    Medicaid-accepting [facility] Providers:  Organization         Address  Phone   Notes  [facility] [address], [place] [phone] Also accepts self-pay patients.  [facility] [address] Laurell Josephs [place], [place]  [phone]   [facility] [address], [place] [phone]   [facility] [address], [place] [phone]   Renaye Rakers [address], [place]   [phone] Only accepts [place] patients after they have their name applied to their card.   Self-Pay (no insurance) in [facility]:  Organization         Address  Phone   Notes  Sickle Cell Patients, [facility] [address]  [place], [place] [phone]   [facility] [address] [place], [place] [phone]   [facility] [place]  [address], [place] [phone]   [facility]/Dr. Osei-Bonsu  [address] [place] or [address], [place] [phone] Phone number for both [place] and [place] locations is the same.  [facility]gent Medical and [facility] [address], [place] [phone]   [facility] [address], [place] or [address] [phone]   [facility] [address], [place] [phone], phone; [phone], fax Sees patients 1st and 3rd Saturday of every month.  Must not qualify for public or private insurance (i.e. Medicaid, Medicare, [place] Health Choice, Veterans' Benefits)  Household income should be no more than 200% of the poverty level The clinic cannot treat you if you are pregnant or think you are pregnant  Sexually transmitted diseases are not treated at the clinic.    Dental Care: Organization         Address  Phone  Notes  [facility] Department of [facility] [address] [place], [place] [phone] Accepts children up to age 61 who are enrolled  in [place] or [place] Health Choice; pregnant women with a Medicaid card; and children who have applied for Medicaid or [place] Health Choice, but were declined, whose parents can pay a reduced fee at time of service.  [facility] Department of [facility]  [address], [place] [phone] Accepts children up to age 70 who are enrolled in [place] or [place] Health Choice; pregnant women with a Medicaid card; and children who have applied for Medicaid or [place] Health Choice, but were declined, whose parents can pay a reduced fee at time of service.  [facility]  [address] [place], [place] [phone] Patients are seen by appointment only. Walk-ins are not accepted. [facility] will see patients 80 years of age and older. Monday - Tuesday (8am-5pm) Most Wednesdays (8:30-5pm) $30 per visit, cash only  [facility] Adult Dental Access PROGRAM  [address], [facility] [phone] Patients are seen by appointment only. Walk-ins are not accepted. [facility] will see patients 67 years of age and older. One Wednesday Evening (Monthly: Volunteer Based).  $30 per visit, cash only  [employer]  [phone] for adults; Children under age 85, call Graduate Pediatric Dentistry at [phone]. Children aged 57-14, please call [phone] to request a pediatric application.  Dental services are provided in all areas of dental care including fillings, crowns and bridges, complete and partial dentures, implants, gum treatment, root canals, and extractions. Preventive care is also provided. Treatment is provided to both adults and children. Patients are selected via a lottery and there is often a waiting list.   [facility] [address], [place]  [phone] [email or web address]   [facility] [address] [place], [place] [phone], Ext. [phone] Second and Fourth Thursday of each month, opens at  6:30 AM; Clinic ends at 9 AM.  Patients are seen on a first-come first-served basis, and a limited number are seen during each clinic.   [facility]  [address] Ether Griffins [place], [place] [phone]   Eligibility Requirements You must have lived in [place], [place], or  [place] counties for at least the last three months.   You cannot be eligible for state or federal sponsored [place] Cityhealthcare insurance, including CIGNAVeterans Administration, [place]icaid, or [employer]icare.   You generally cannot be eligible for healthcare insurance through your employer.    How to apply: Eligibility screenings are held every Tuesday and Wednesday afternoon from 1:00 pm until 4:00 pm. You do not need an appointment for the interview!  [facility] [address], [place], [place] [phone]   [facility]  [phone]   [facility]  [phone]   [facility]  [phone]    Behavioral Health Resources in the Community: Intensive Outpatient Programs Organization         Address  Phone  Notes  [facility] [address], [place], [place] [phone]   [facility] [address], [place], [place] [phone]   [facility] [address], [place], [place]  [phone]   [facility] [address],  [place], [place] [phone] or [phone]   Substance Abuse Resources Organization         Address  Phone  Notes  [facility]  [phone]   [facility]  [phone]   [facility]  [phone]   [facility]  [phone]   Residential & Outpatient Substance Abuse Program  [phone]   Psychological Services Organization         Address  Phone  Notes  [facility]  [phone]- [phone]   [facility]  [phone]- [phone]   [facility] [address], [place]  [phone] or [phone]    Mobile Crisis Teams Organization         Address  Phone  Notes  [facility], Mobile Crisis Care Unit  [phone]   [facility]  [address] [place], [place] [phone]   Doristine LocksSharon DeEsch [address] [place] [phone]    Self-Help/Support Groups Organization         Address  Phone             Notes  Mental Health Assoc. of  - variety of support groups  336- [phone] Call for more information  Narcotics Anonymous (NA), [facility] [address], [place]  2 meetings at this location   Statisticianesidential Treatment Programs Organization         Address  Phone  Notes  [facility] [address],    [place]  [phone]   [facility]  [address], [place] [phone], [place], [place] [phone]   [facility] [address], [place] [phone] Admissions: 8am-3pm M-F  [facility] [address]    [place], [place] [phone]   [facility] [address], [place], [place] [phone]   [facility] [address]  [place], [place] [phone]   [facility] - Intensive Outpatient [address], [place], [place] [phone]   [facility] ([facility] [address]  [place], [place] [phone] or [phone]   [facility] ([facility]) [address] [place], [place] [phone] Accepts Medicaid  [facility] [address]  [place] [phone] Substance Abuse/Addiction Treatment   [facility] Resources Organization         Address  Phone  Notes  Estate manager/land agentCenterPoint Human Services  (  [phone]   Angie Fava, PhD [address], Ervin Knack [place], [place]   [phone] or [phone]   [facility] Behavioral   [address] [place], [place] [phone]   [facility]  [address] [place], [place] [phone] Insurance/Medicaid/sponsorship through [facility] and Families [address]                                    [place], [place] [phone] Therapy/tele-psych/case  [facility] [address], [place] [phone]    Dr. Lolly Mustache  [phone]   [facility]  [employer] [facility] Dept. 1) [address], [place] 2) [address] [place] 3)  [address], [place] [phone]  [phone]   [facility] Child Abuse Hotline [phone] or [phone] (After Hours)

## 2014-10-07 NOTE — ED Provider Notes (Signed)
CSN: [id number]     Arrival date & time 10/07/14  1524 History   First MD Initiated Contact with Patient 10/07/14 1723     Chief Complaint  Patient presents with  . Shortness of Breath     (Consider location/radiation/quality/duration/timing/severity/associated sxs/prior Treatment) The history is provided by the patient and medical records. No language interpreter was used.     Denise Roberts is a 29 y.o. female  with a hx of asthma presents to the Emergency Department complaining of gradual, persistent, progressively worsening URI symptoms onset 2 days ago.  Pt reports nasal congestion, post nasal drip, sinus pressure, rhinorrhea, sinus headache, ST.  Pt reports she began feeling SOB last night while laying down.  She reports this is due to the large amount of post nasal drip she is having and this makes her feel like she is choking.  Pt also reports central, waxing and waning chest tightness worsening since yesterday.   She reports she does not take any daily medications for asthma and has no rescue inhaler.  She reports hx of bronchitis and denies smoking.  She reports associated  Subjective fevers.  She denies sick contacts.  Pt has taken theraflu, nyquil and sudafed without relief.  Pt denies chills, neck pain,  Abdominal pain, nausea, vomiting, diarrhea, weakness, dizziness, syncope, dysuria, hematuria.      Past Medical History  Diagnosis Date  . Chlamydia   . Ovarian cyst 2011  . Anemia   . Asthma   . Abnormal pap 2007   Past Surgical History  Procedure Laterality Date  . Wisdom tooth extraction     Family History  Problem Relation Age of Onset  . Cancer Maternal Grandfather     prostate  . Cancer Brother   . Diabetes Maternal Aunt    History  Substance Use Topics  . Smoking status: Never Smoker   . Smokeless tobacco: Never Used  . Alcohol Use: Yes   OB History    Gravida Para Term Preterm AB TAB SAB Ectopic Multiple Living   2    2     0     Review of Systems   Constitutional: Positive for fatigue. Negative for fever, chills and appetite change.  HENT: Positive for congestion, postnasal drip, rhinorrhea, sinus pressure and sore throat. Negative for ear discharge, ear pain and mouth sores.   Eyes: Negative for visual disturbance.  Respiratory: Positive for chest tightness. Negative for cough, shortness of breath, wheezing and stridor.   Cardiovascular: Negative for chest pain, palpitations and leg swelling.  Gastrointestinal: Negative for nausea, vomiting, abdominal pain and diarrhea.  Genitourinary: Negative for dysuria, urgency, frequency and hematuria.  Musculoskeletal: Negative for myalgias, back pain, arthralgias and neck stiffness.  Skin: Negative for rash.  Neurological: Positive for headaches ( sinus). Negative for syncope, light-headedness and numbness.  Hematological: Negative for adenopathy.  Psychiatric/Behavioral: The patient is not nervous/anxious.   All other systems reviewed and are negative.     Allergies  Review of patient's allergies indicates no known allergies.  Home Medications   Prior to Admission medications   Medication Sig Start Date End Date Taking? Authorizing Provider  benzonatate (TESSALON) 100 MG capsule Take 1 capsule (100 mg total) by mouth every 8 (eight) hours. 10/07/14   Nalia Honeycutt, PA-C  cephALEXin (KEFLEX) 500 MG capsule Take 1 capsule (500 mg total) by mouth 2 (two) times daily. 06/05/14   Tomasita Crumble, MD  fluticasone (FLONASE) 50 MCG/ACT nasal spray Place 2 sprays into both  nostrils daily. 10/07/14   Jovontae Banko, PA-C  predniSONE (DELTASONE) 20 MG tablet Take 2 tablets (40 mg total) by mouth daily. 10/07/14   Cortne Amara, PA-C   BP 116/80 mmHg  Pulse 82  Temp(Src) 99.1 F (37.3 C) (Oral)  Resp 14  Ht 5\' 3"  (1.6 m)  Wt 237 lb (107.502 kg)  BMI 41.99 kg/m2  SpO2 100%  LMP 09/30/2014 (Exact Date) Physical Exam  Constitutional: She is oriented to person, place, and time. She  appears well-developed and well-nourished. No distress.  HENT:  Head: Normocephalic and atraumatic.  Right Ear: Tympanic membrane, external ear and ear canal normal.  Left Ear: Tympanic membrane, external ear and ear canal normal.  Nose: Mucosal edema and rhinorrhea present. No epistaxis. Right sinus exhibits no maxillary sinus tenderness and no frontal sinus tenderness. Left sinus exhibits no maxillary sinus tenderness and no frontal sinus tenderness.  Mouth/Throat: Uvula is midline and mucous membranes are normal. Mucous membranes are not pale and not cyanotic. No oropharyngeal exudate, posterior oropharyngeal edema, posterior oropharyngeal erythema or tonsillar abscesses.  Postnasal drip visible in the posterior oropharynx  Eyes: Conjunctivae are normal. Pupils are equal, round, and reactive to light.  Neck: Normal range of motion and full passive range of motion without pain.  Cardiovascular: Normal rate, regular rhythm, normal heart sounds and intact distal pulses.   No murmur heard. Pulmonary/Chest: Effort normal. No stridor. She has decreased breath sounds.  Diminished but clear and equal breath sounds without focal wheezes, rhonchi, rales  Abdominal: Soft. Bowel sounds are normal. She exhibits no distension. There is no tenderness.  Musculoskeletal: Normal range of motion.  Lymphadenopathy:    She has no cervical adenopathy.  Neurological: She is alert and oriented to person, place, and time. Coordination normal.  Skin: Skin is warm and dry. No rash noted. She is not diaphoretic.  Psychiatric: She has a normal mood and affect.  Nursing note and vitals reviewed.   ED Course  Procedures (including critical care time) Labs Review Labs Reviewed  BASIC METABOLIC PANEL - Abnormal; Notable for the following:    Glucose, Bld 108 (*)    BUN 5 (*)    GFR calc non Af Amer 89 (*)    All other components within normal limits  CBC  I-STAT TROPOININ, ED    Imaging Review Dg Chest 2  View (if Patient Has Fever And/or Copd)  10/07/2014   CLINICAL DATA:  Chest pain and difficulty breathing for 2 days  EXAM: CHEST  2 VIEW  COMPARISON:  December 11, 2012  FINDINGS: Lungs are clear. Heart is upper normal in size with pulmonary vascularity within normal limits. No adenopathy. 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 No pneumothorax.  IMPRESSION: No edema or consolidation.  Cervical rib on left.   Electronically Signed   By: Bretta BangWilliam  Woodruff III M.D.   On: 10/07/2014 16:23     EKG Interpretation   Date/Time:  Saturday October 07 2014 15:32:36 EST Ventricular Rate:  77 PR Interval:  142 QRS Duration: 86 QT Interval:  370 QTC Calculation: 418 R Axis:   15 Text Interpretation:  Normal sinus rhythm Nonspecific T wave abnormality  Abnormal ECG Confirmed by Lincoln Brighamees, Liz [phone]([id number]) on 10/07/2014 7:17:56 PM      MDM   Final diagnoses:  URI (upper respiratory infection)  Bronchitis with bronchospasm   Golden CircleLajoia S Ringwald presents with URI symptoms and complaints of excessive postnasal drip.  She reports central chest pressure but has a history  of asthma and has not used an inhaler recently. She reports her shortness of breath was more associated with her postnasal drip. She is not tachycardic in the room.  PERC negative and without risk factors for DVT including no recent travel, no estrogen, no leg swelling, no history of DVT no recent surgeries or broken bones.  Will give albuterol and reassess.    7:13 PM Pt with improved tidal volume and patient reports resolution of chest tightness. She is some decongested after the Afrin. We'll discharge home with symptomatic treatment including albuterol, prednisone, Mucinex, Tessalon and instructions to follow-up with her primary care physician.  Pt CXR negative for acute infiltrate. Patients symptoms are consistent with URI, likely viral etiology  And subsequent asthma exacerbation. Discussed that antibiotics are not indicated for viral infections. Pt will  be discharged with symptomatic treatment.  Verbalizes understanding and is agreeable with plan. Pt is hemodynamically stable & in NAD prior to dc.   BP 116/80 mmHg  Pulse 82  Temp(Src) 99.1 F (37.3 C) (Oral)  Resp 14  Ht  (1.6 m)  Wt 237 lb (107.502 kg)  BMI 41.99 kg/m2  SpO2 100%  LMP 09/30/2014 (Exact Date)   Dierdre Forth, PA-C 10/07/14 2324  Tilden Fossa, MD 10/07/14 [phone]

## 2014-10-07 NOTE — ED Notes (Signed)
Pt c/o shortness of breath onset last night while laying down. Pt denies cough, reports non radiating center chest pain. Pt reports being lightheaded.

## 2016-06-14 IMAGING — US US RENAL
1 series · 14 of 25 positions shown · non-contrast
Comparison: None.

CLINICAL DATA: Evaluate for LEFT kidney stone and/or
hydronephrosis. Acute symptoms.

EXAM:
RENAL/URINARY TRACT ULTRASOUND COMPLETE

[Series 1: us renal · 0.20mm/px · 14 of 27 slices shown]
[im 1/27]
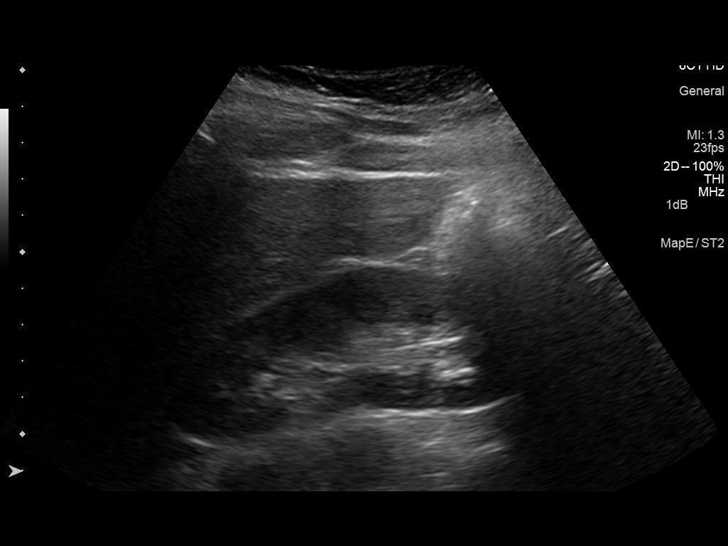
[im 3/27]
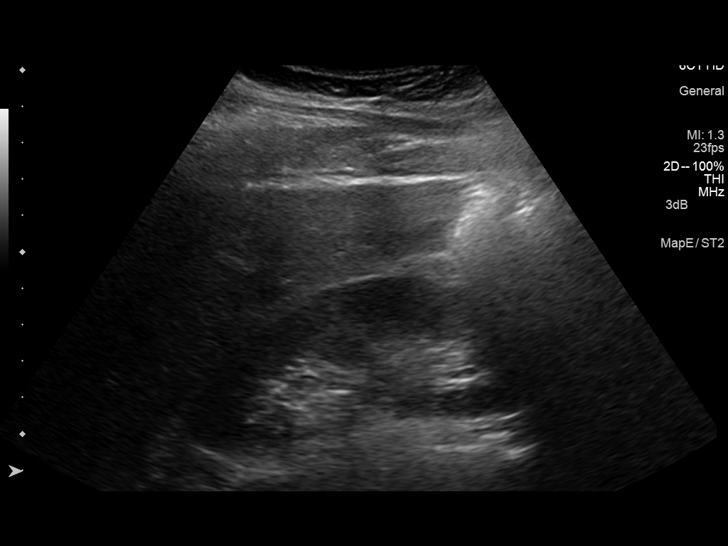
[im 5/27]
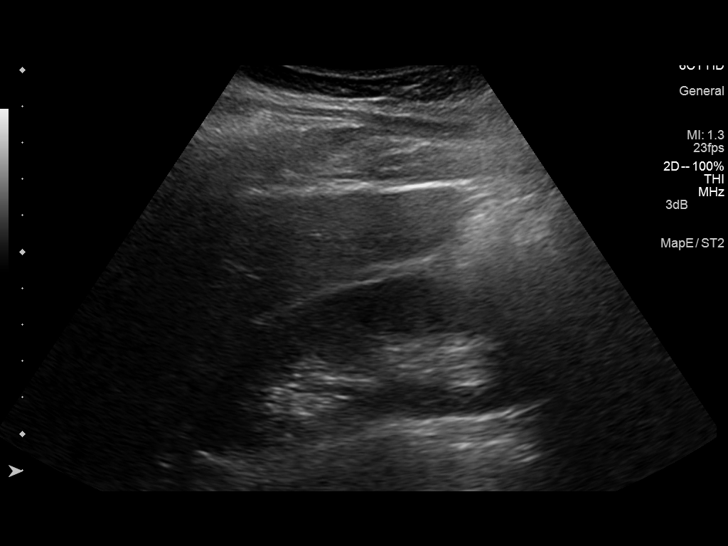
[im 7/27]
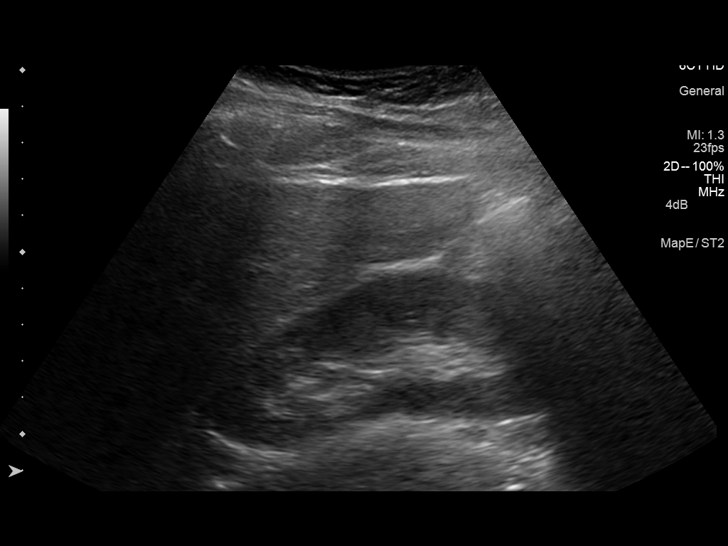
[im 9/27]
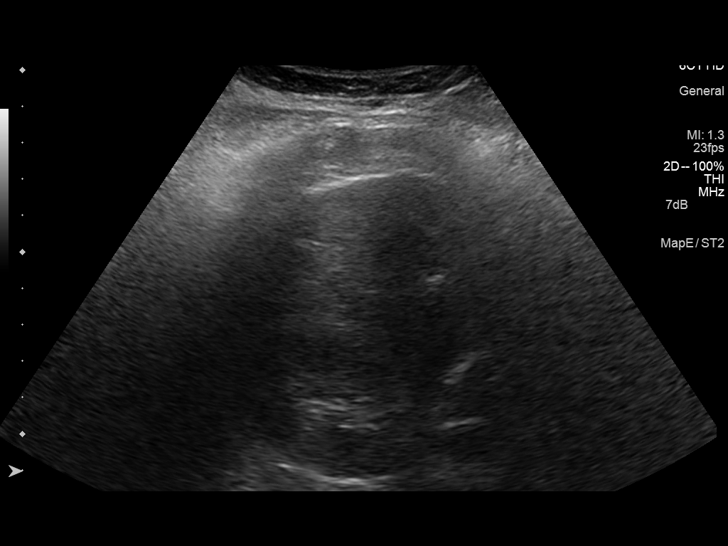
[im 10/27]
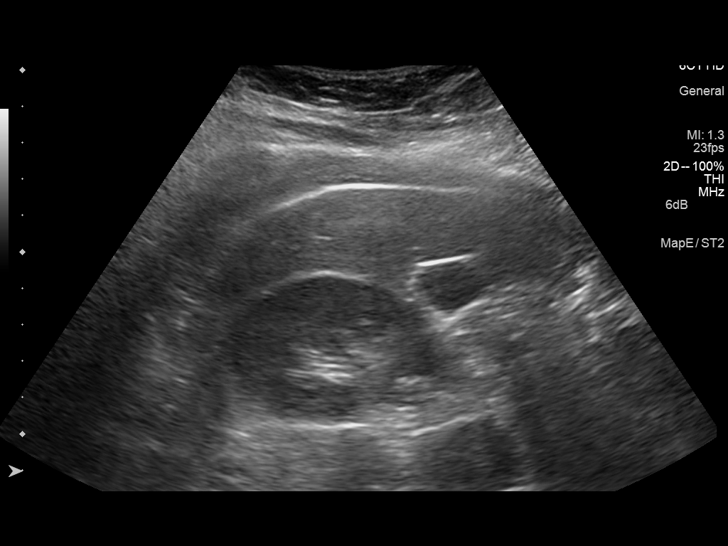
[im 12/27]
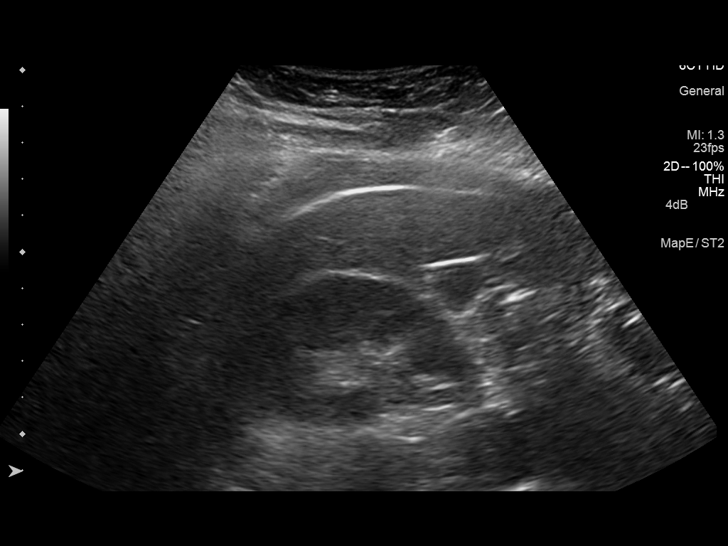
[im 15/27]
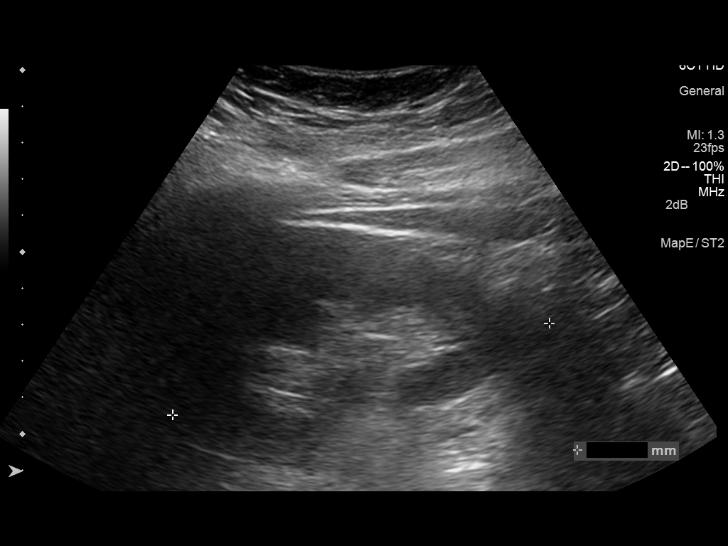
[im 17/27]
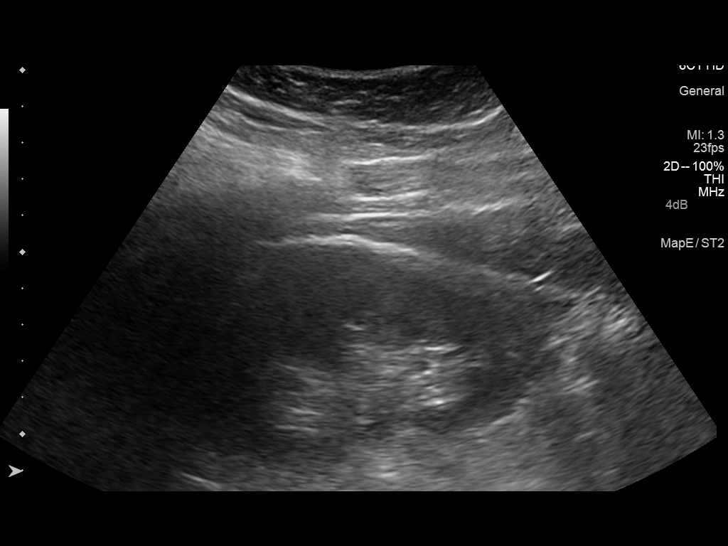
[im 18/27]
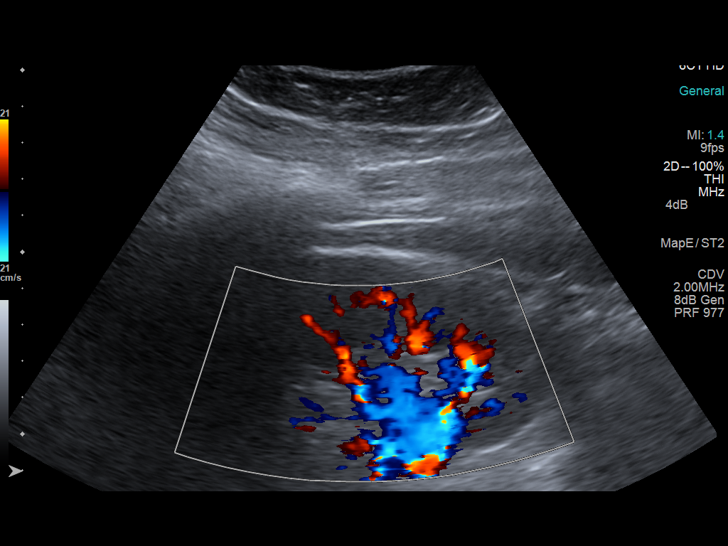
[im 20/27]
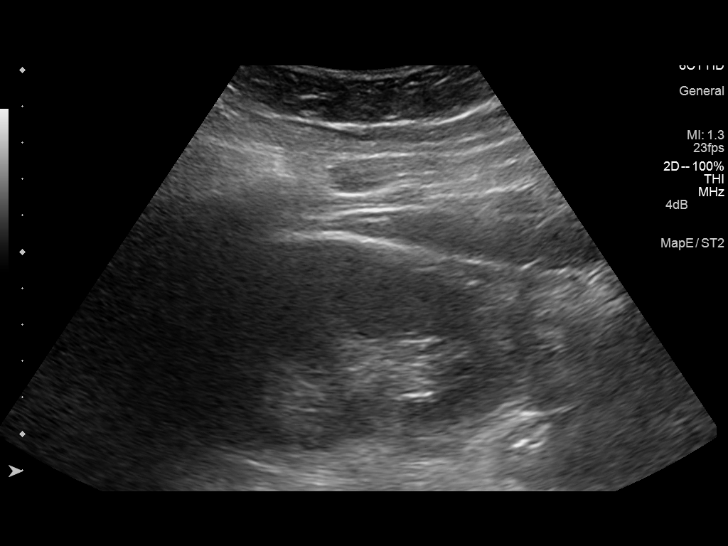
[im 22/27]
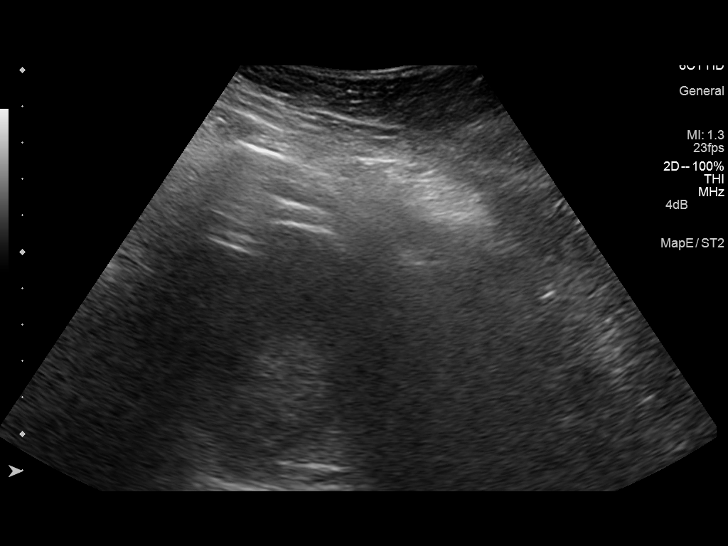
[im 24/27]
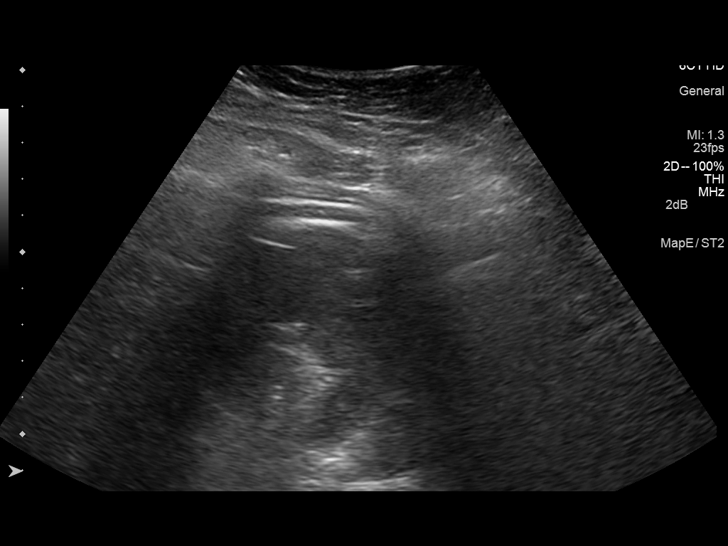
[im 27/27]
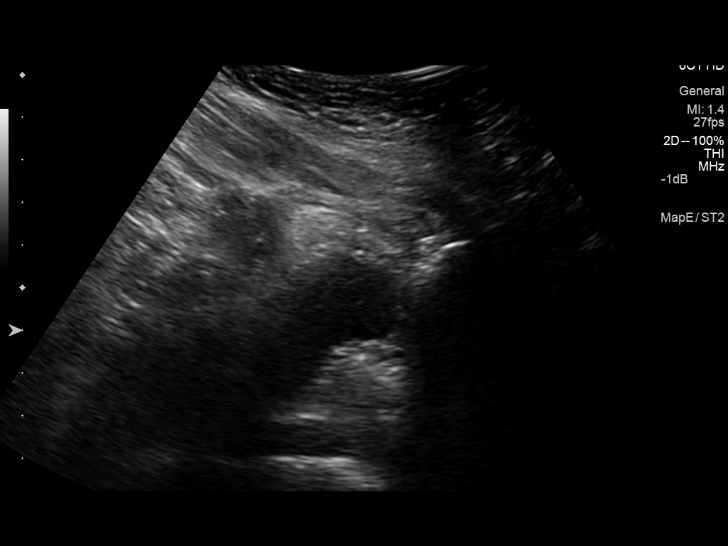

[14 of 25 positions shown; findings below may reference images not displayed]

FINDINGS: Right Kidney:

Length: 10.6 cm. Echogenicity within normal limits. No mass or
hydronephrosis visualized.

Left Kidney:

Length: 10.7 cm. Echogenicity within normal limits. No mass or
hydronephrosis visualized.

Bladder:

Appears normal for degree of bladder distention.
IMPRESSION: Normal renal ultrasound.

  By: Leonel Rafael Javaid

## 2016-10-05 IMAGING — CR DG FOOT COMPLETE 3+V*L*
3 series · 3 of 3 positions shown · non-contrast
Comparison: None.

CLINICAL DATA: Status post fall at work last might with pain
medially extending from the calcaneus to the first and second toes
involving the ball of the foot, as tingling sensation

EXAM:
LEFT FOOT - COMPLETE 3+ VIEW

[t foot ap left]
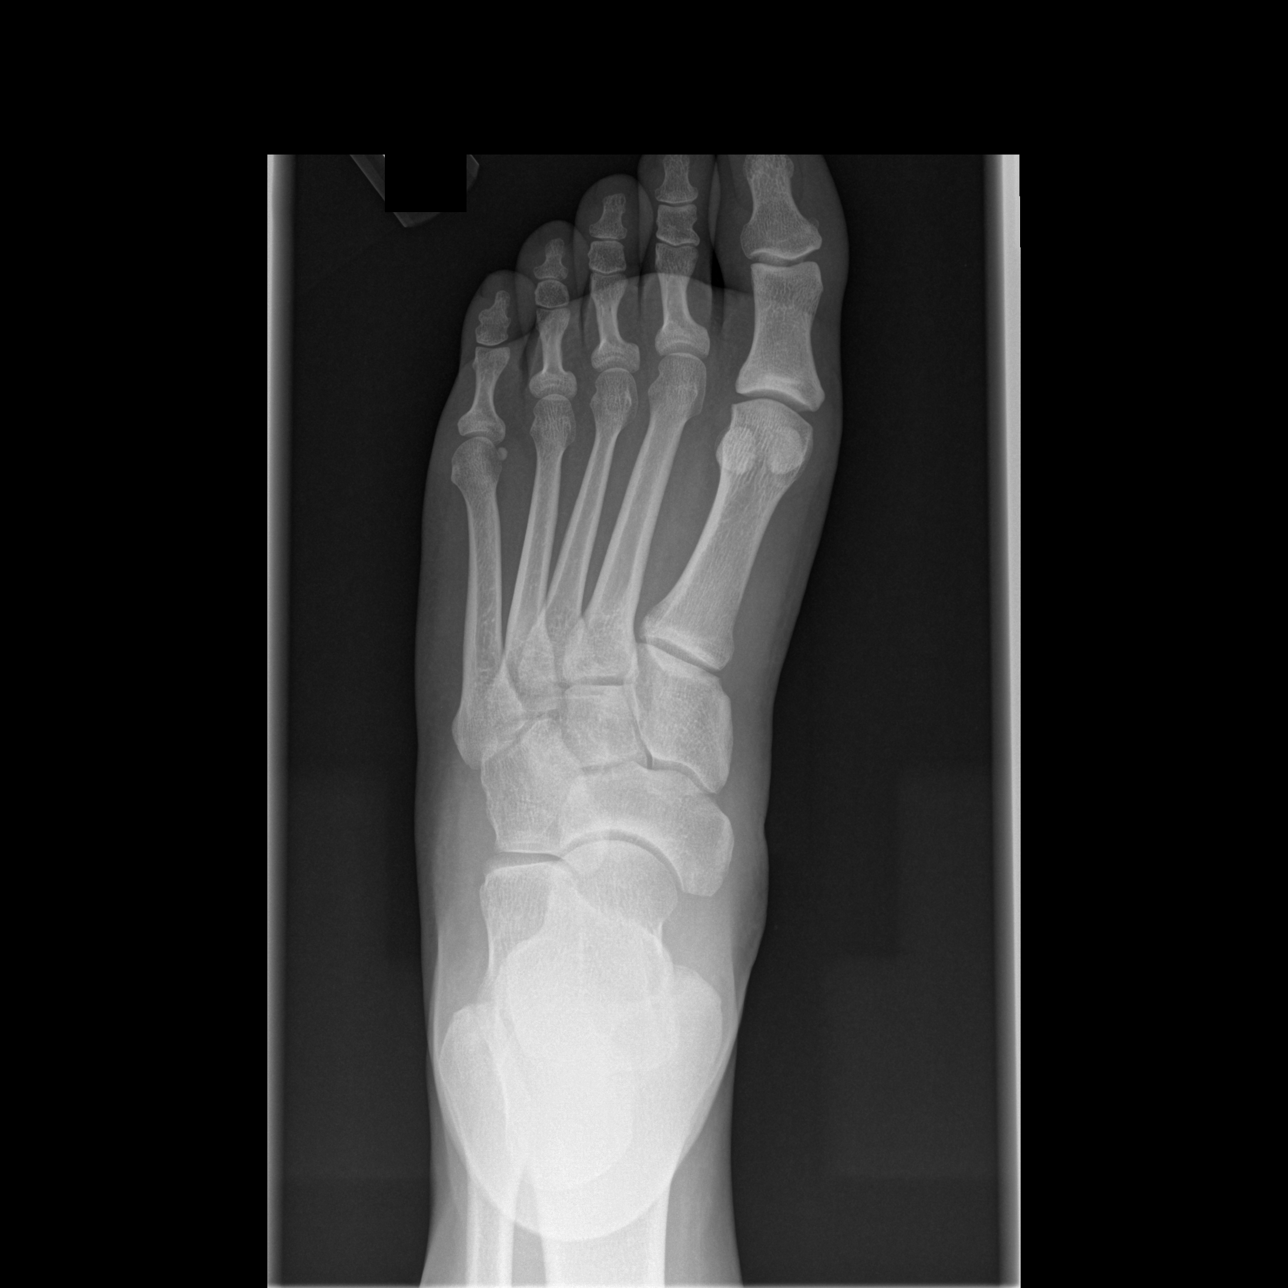

[t foot oblique left]
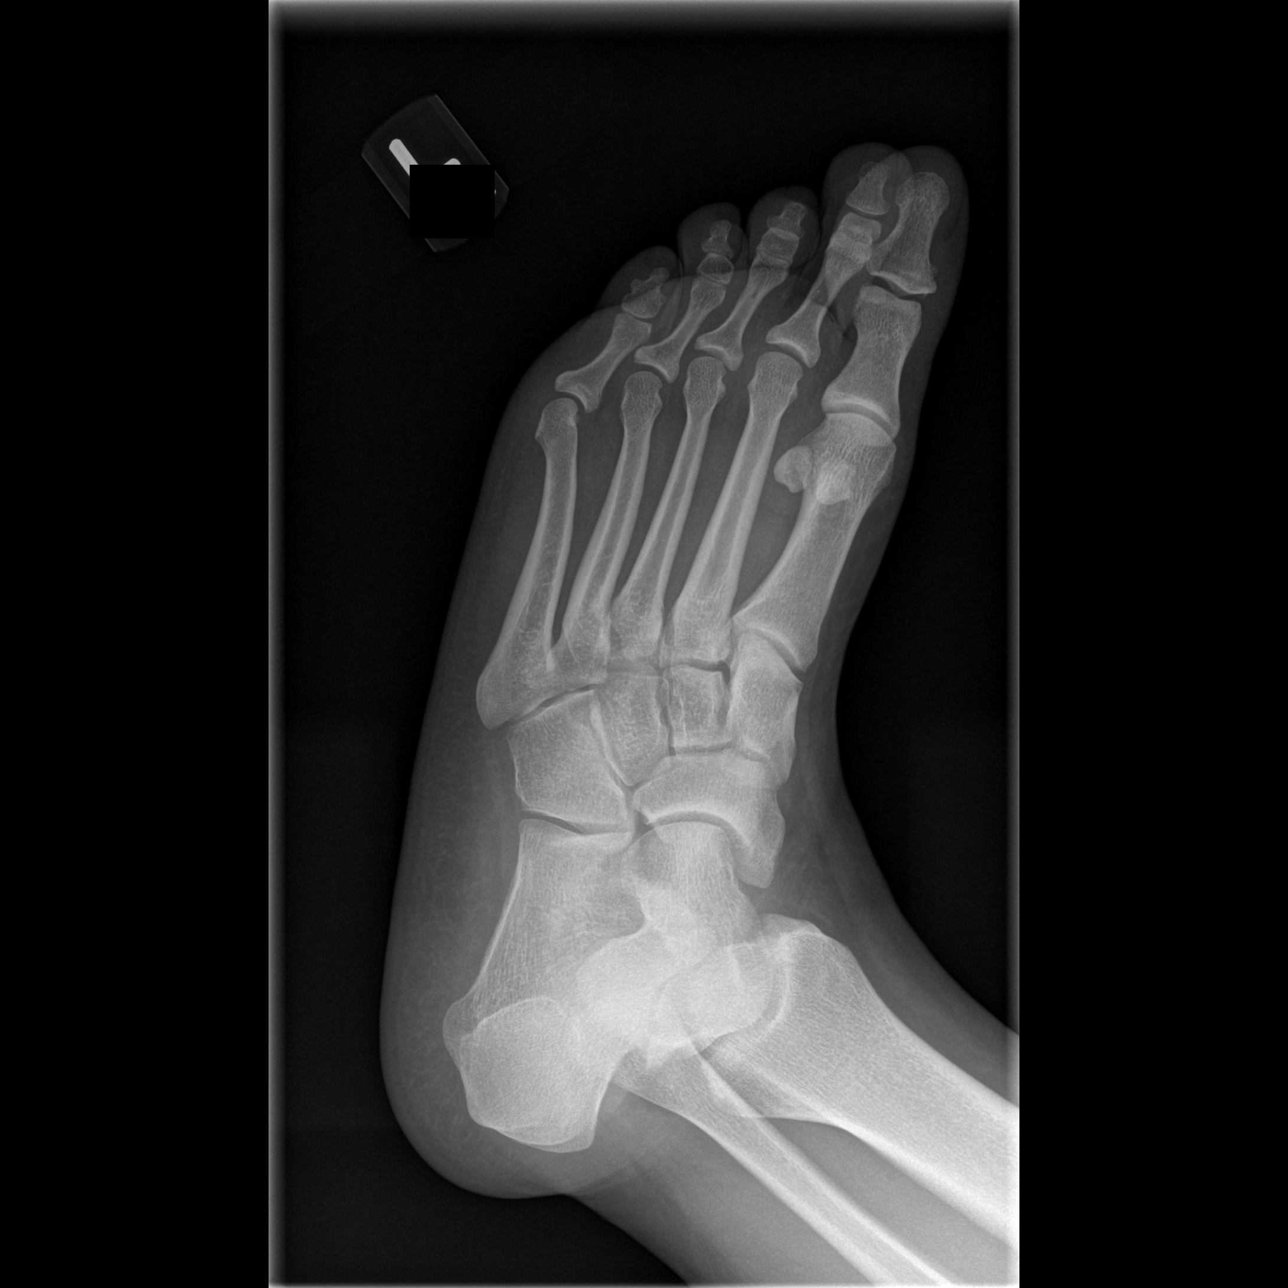

[t foot lat left]
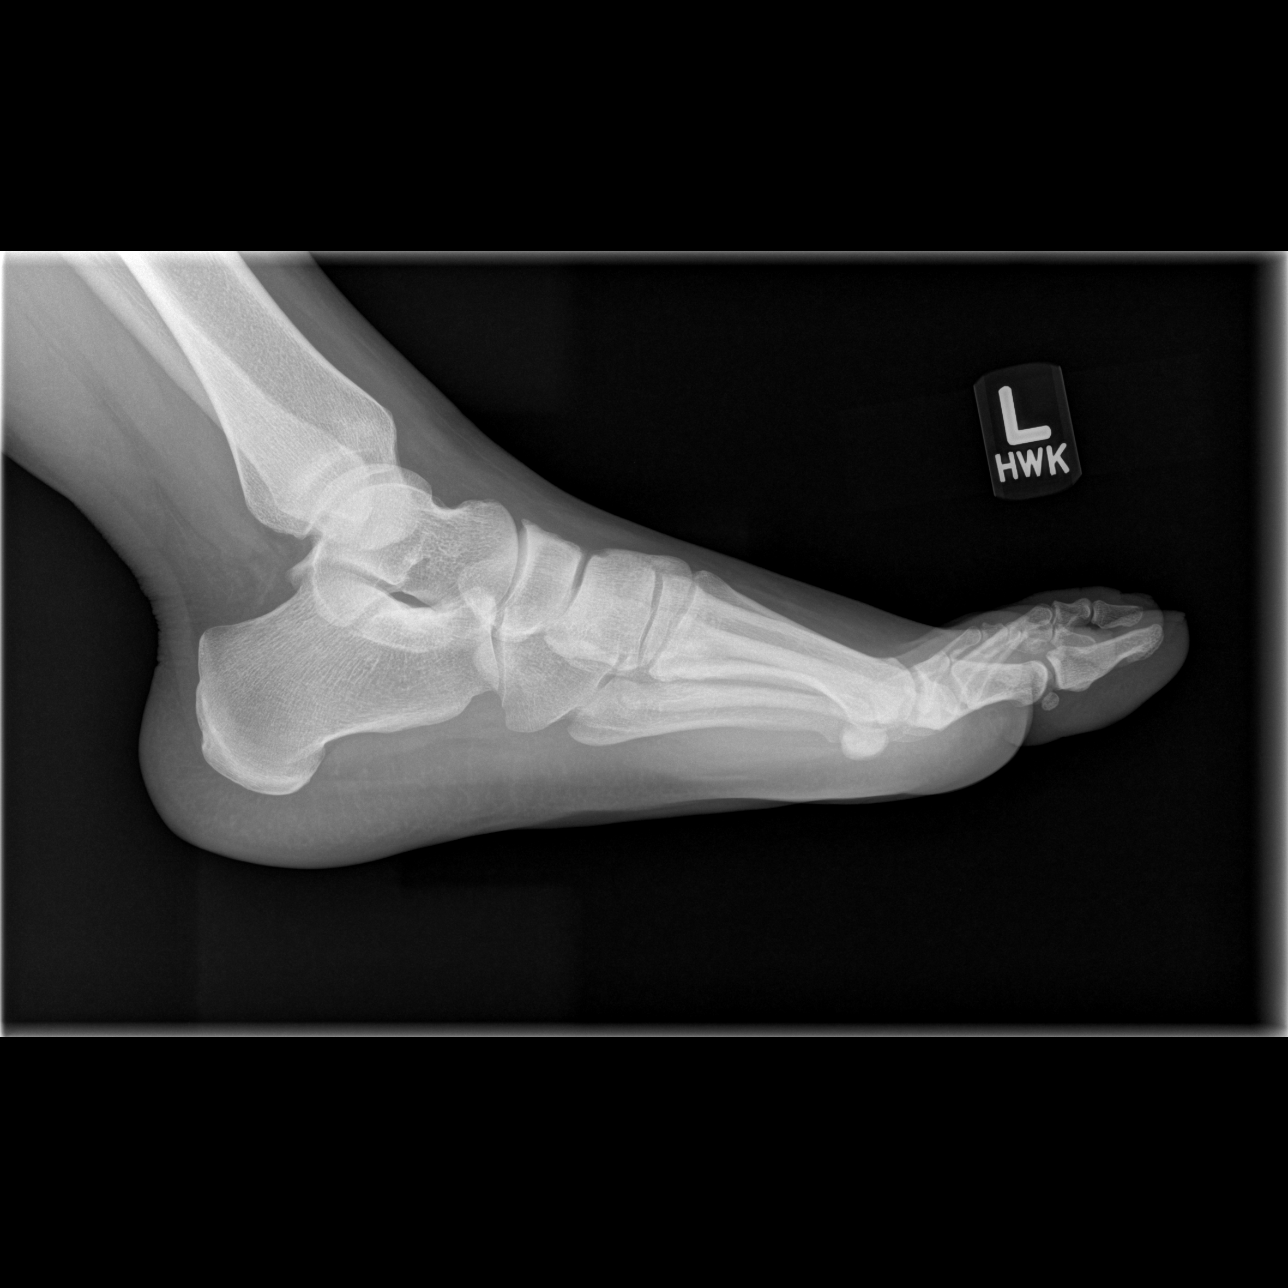

[3 of 3 positions shown; findings below may reference images not displayed]

FINDINGS: The bones of the left foot are adequately mineralized. There is no
acute fracture nor dislocation. The joint spaces are preserved. The
soft tissues are unremarkable. Specific attention to the calcaneus
reveals no acute abnormality.
IMPRESSION: There is no acute fracture nor dislocation of the bones of the left
foot.
